# Patient Record
Sex: Male | Born: 1985 | Race: White | Hispanic: No | Marital: Single | Smoking: Current every day smoker
Health system: Southern US, Community
[De-identification: ages and names within clinical notes are randomized; demographics above are authoritative.]

## PROBLEM LIST (undated history)

## (undated) DIAGNOSIS — F419 Anxiety disorder, unspecified: Secondary | ICD-10-CM

---
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
## 2009-01-04 ENCOUNTER — Emergency Department (HOSPITAL_COMMUNITY): Admission: EM | Admit: 2009-01-04 | Discharge: 2009-01-04 | Payer: Self-pay | Admitting: Family Medicine

## 2009-05-13 ENCOUNTER — Emergency Department (HOSPITAL_COMMUNITY): Admission: EM | Admit: 2009-05-13 | Discharge: 2009-05-13 | Payer: Self-pay | Admitting: Family Medicine

## 2009-09-18 ENCOUNTER — Emergency Department (HOSPITAL_COMMUNITY): Admission: EM | Admit: 2009-09-18 | Discharge: 2009-09-18 | Payer: Self-pay | Admitting: Emergency Medicine

## 2009-09-23 ENCOUNTER — Emergency Department (HOSPITAL_COMMUNITY): Admission: EM | Admit: 2009-09-23 | Discharge: 2009-09-23 | Payer: Self-pay | Admitting: Family Medicine

## 2011-07-04 IMAGING — CR DG RIBS W/ CHEST 3+V*L*
3 series · 3 of 3 positions shown · non-contrast
Comparison: 09/18/2009

CLINICAL DATA: Chest pain

LEFT RIBS AND CHEST - 3+ VIEW

[view not recorded (1 of 3)]
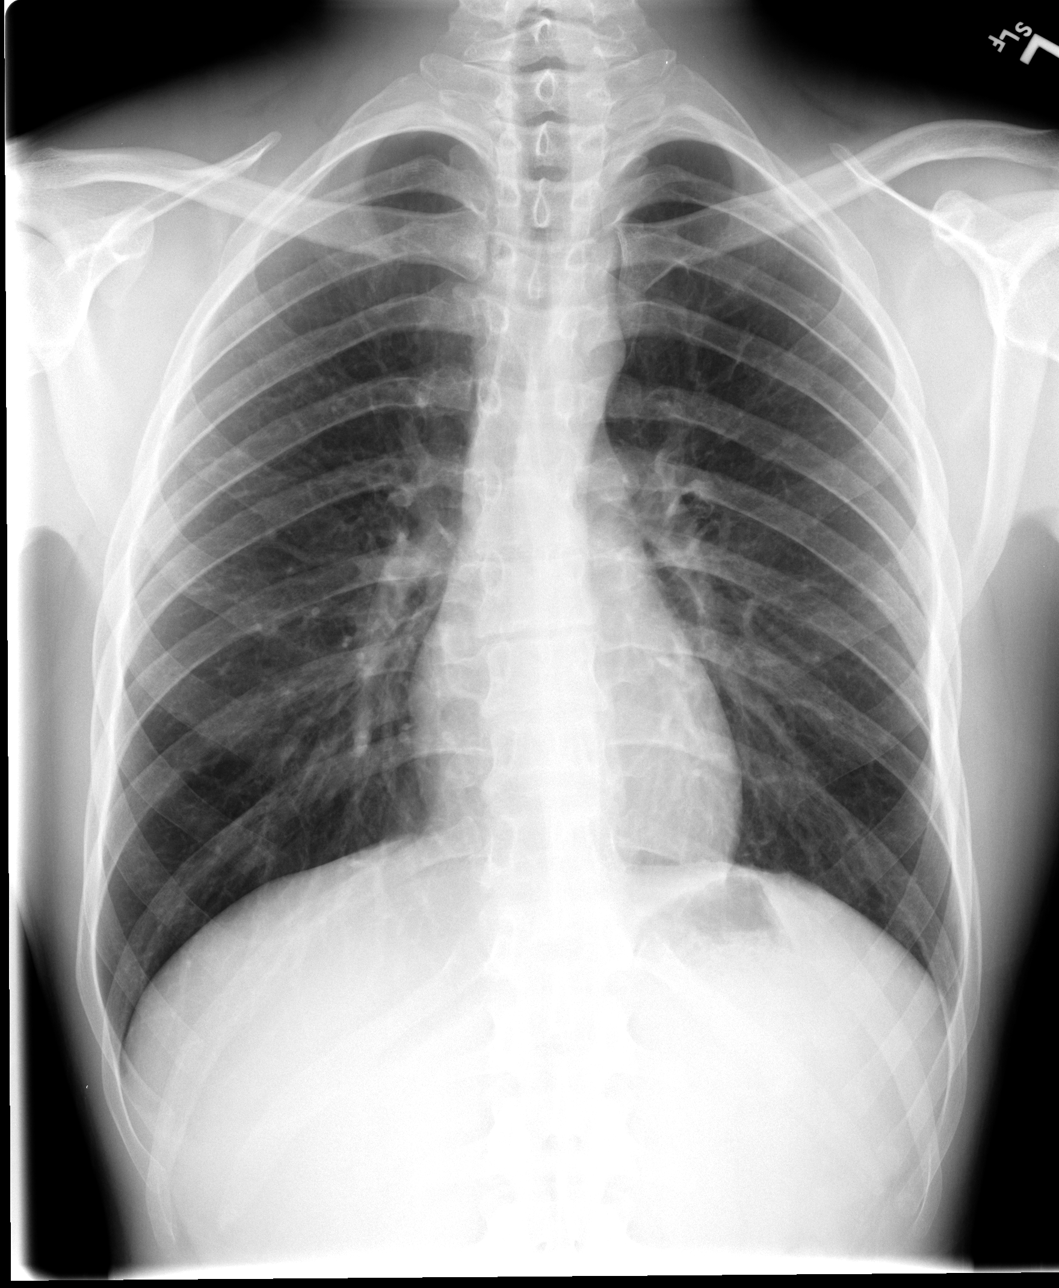

[view not recorded (2 of 3)]
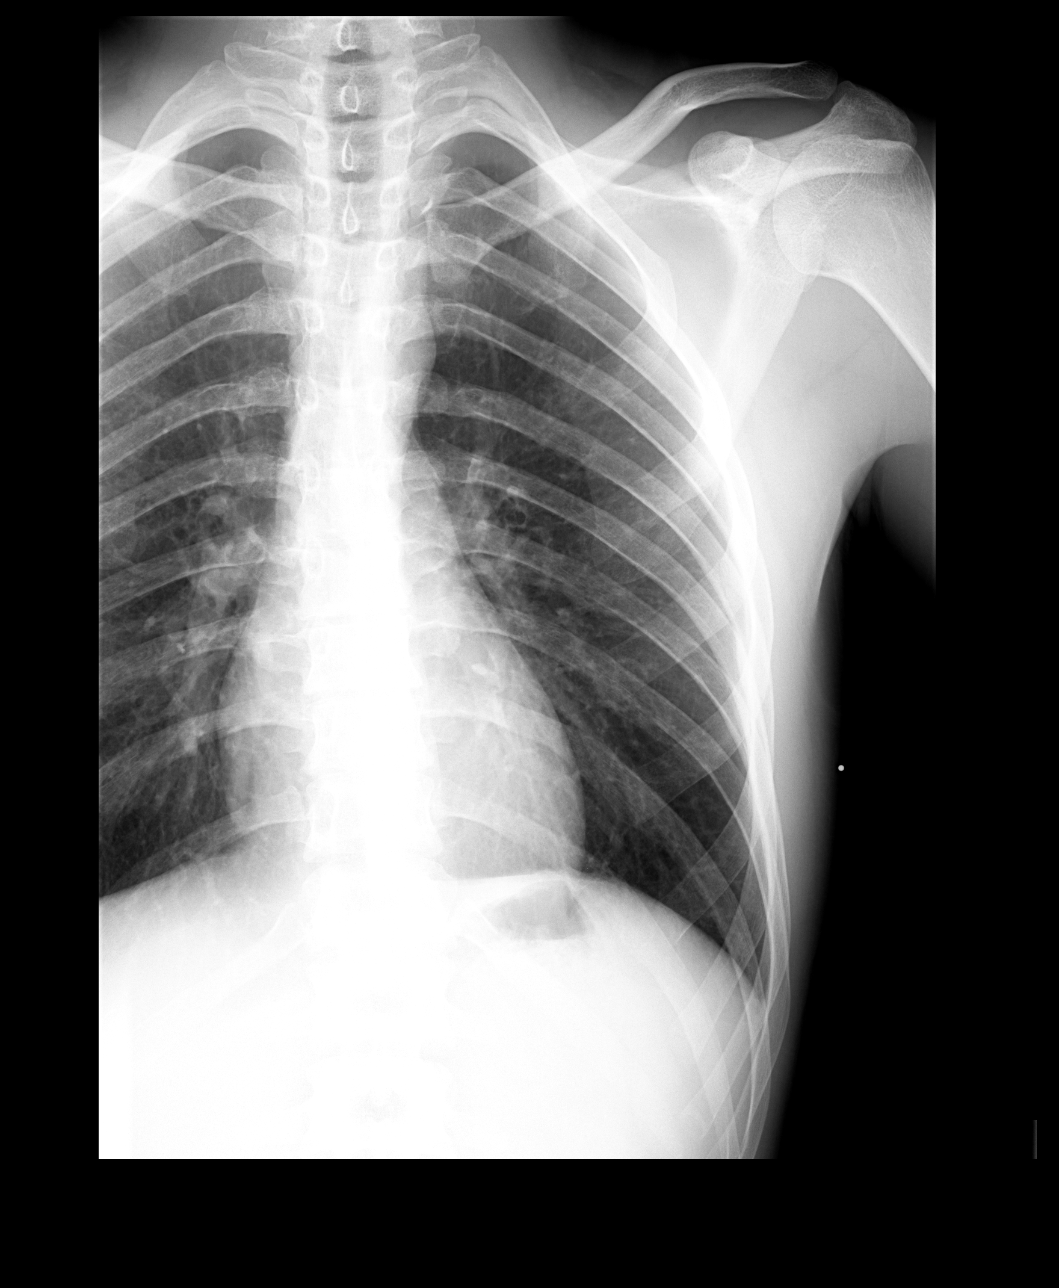

[view not recorded (3 of 3)]
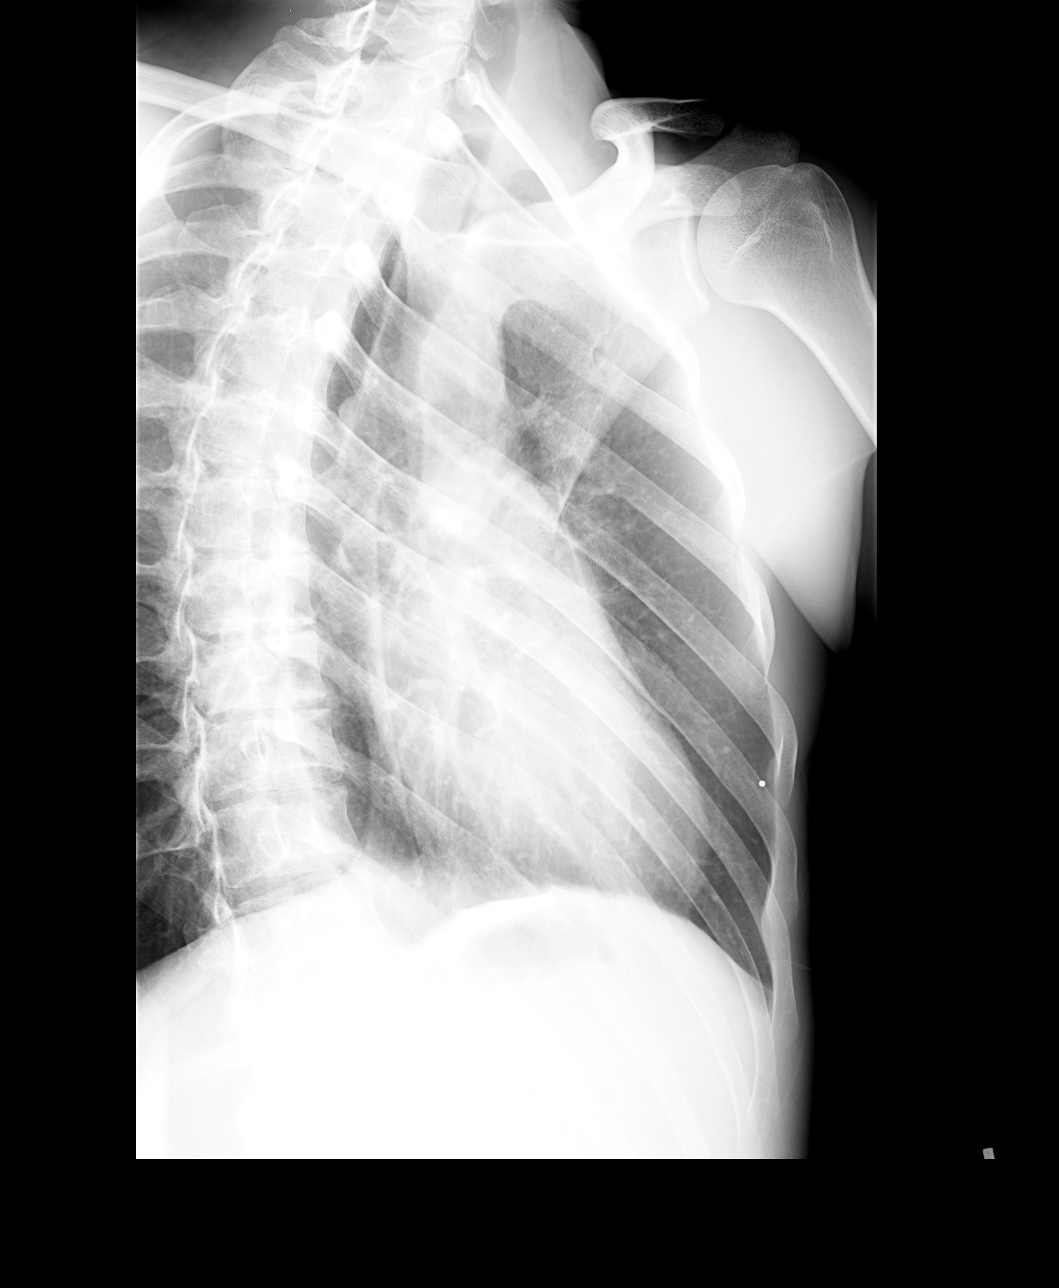

[3 of 3 positions shown; findings below may reference images not displayed]

FINDINGS: Heart size is normal.

No pleural effusion or pulmonary edema .

No airspace consolidation identified.

Linear lucency through the anterior aspect of the left sixth rib is
suspicious for nondisplaced fracture.

There is no additional fractures or dislocations identified.
IMPRESSION: 1.  No active cardiopulmonary abnormalities.
2.  Suspect nondisplaced fracture involving the anterior aspect of
the left sixth rib.

## 2011-07-07 ENCOUNTER — Encounter (HOSPITAL_COMMUNITY): Payer: Self-pay | Admitting: Emergency Medicine

## 2011-07-07 ENCOUNTER — Emergency Department (INDEPENDENT_AMBULATORY_CARE_PROVIDER_SITE_OTHER)
Admission: EM | Admit: 2011-07-07 | Discharge: 2011-07-07 | Disposition: A | Discharge status: Home / Self Care | Payer: Self-pay | Source: Home / Self Care | Attending: Emergency Medicine | Admitting: Emergency Medicine

## 2011-07-07 ENCOUNTER — Encounter (HOSPITAL_COMMUNITY): Payer: Self-pay

## 2011-07-07 ENCOUNTER — Emergency Department (HOSPITAL_COMMUNITY)
Admission: EM | Admit: 2011-07-07 | Discharge: 2011-07-07 | Discharge status: Against Medical Advice | Payer: Self-pay | Attending: Emergency Medicine | Admitting: Emergency Medicine

## 2011-07-07 DIAGNOSIS — S069X9A Unspecified intracranial injury with loss of consciousness of unspecified duration, initial encounter: Secondary | ICD-10-CM

## 2011-07-07 DIAGNOSIS — S0181XA Laceration without foreign body of other part of head, initial encounter: Secondary | ICD-10-CM

## 2011-07-07 DIAGNOSIS — S060X9A Concussion with loss of consciousness of unspecified duration, initial encounter: Secondary | ICD-10-CM

## 2011-07-07 DIAGNOSIS — S0180XA Unspecified open wound of other part of head, initial encounter: Secondary | ICD-10-CM

## 2011-07-07 HISTORY — DX: Anxiety disorder, unspecified: F41.9

## 2011-07-07 MED ORDER — IBUPROFEN 800 MG PO TABS: 800.0000 mg | ORAL_TABLET | Freq: Three times a day (TID) | ORAL | Status: AC

## 2011-07-07 NOTE — ED Notes (Signed)
dermabond at bedside for MD use
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
## 2011-07-07 NOTE — Discharge Instructions (Signed)
Head Injury, Adult You have had a head injury that does not appear serious at this time. A concussion is a state of changed mental ability, usually from a blow to the head. You should take clear liquids for the rest of the day and then resume your regular diet. You should not take sedatives or alcoholic beverages for as long as directed by your caregiver after discharge. After injuries such as yours, most problems occur within the first 24 hours. SYMPTOMS These minor symptoms may be experienced after discharge:  Memory difficulties.   Dizziness.   Headaches.   Double vision.   Hearing difficulties.   Depression.   Tiredness.   Weakness.   Difficulty with concentration.  If you experience any of these problems, you should not be alarmed. A concussion requires a few days for recovery. Many patients with head injuries frequently experience such symptoms. Usually, these problems disappear without medical care. If symptoms last for more than one day, notify your caregiver. See your caregiver sooner if symptoms are becoming worse rather than better. HOME CARE INSTRUCTIONS   During the next 24 hours you must stay with someone who can watch you for the warning signs listed below.  Although it is unlikely that serious side effects will occur, you should be aware of signs and symptoms which may necessitate your return to this location. Side effects may occur up to 7 - 10 days following the injury. It is important for you to carefully monitor your condition and contact your caregiver or seek immediate medical attention if there is a change in your condition. SEEK IMMEDIATE MEDICAL CARE IF:   There is confusion or drowsiness.   You can not awaken the injured person.   There is nausea (feeling sick to your stomach) or continued, forceful vomiting.   You notice dizziness or unsteadiness which is getting worse, or inability to walk.   You have convulsions or unconsciousness.   You experience  severe, persistent headaches not relieved by over-the-counter or prescription medicines for pain. (Do not take aspirin as this impairs clotting abilities). Take other pain medications only as directed.   You can not use arms or legs normally.   There is clear or bloody discharge from the nose or ears.  MAKE SURE YOU:   Understand these instructions.   Will watch your condition.   Will get help right away if you are not doing well or get worse.  Document Released: 03/01/2005 Document Revised: 02/18/2011 Document Reviewed: 01/17/2009 [facility] Patient Information 2012 [place], [place].Facial Laceration A facial laceration is a cut on the face. It can take 1 to 2 years for the scar to heal completely. HOME CARE  For stitches (sutures):  Keep the cut clean and dry.   If you have a bandage (dressing), change it at least once a day. Change the bandage if it gets wet or dirty, or as told by your doctor.   Wash the cut with soap and water 2 times a day. Rinse the cut with water. Pat it dry with a clean towel.   Put a thin layer of medicated cream on the cut as told by your doctor.   You may shower after the first 24 hours. Do not soak the cut in water until the stitches are removed.   Only take medicines as told by your doctor.   Have your stitches removed as told by your doctor.   Do not wear makeup until a few days after your stitches are removed.  For skin adhesive strips:  Keep the cut clean and dry.   Do not get the strips wet. You may take a bath, but be careful to keep the cut dry.   If the cut gets wet, pat it dry with a clean towel.   The strips will fall off on their own. Do not remove the strips that are still stuck to the cut.  For wound glue:  You may shower or take baths. Do not soak or scrub the cut. Do not swim. Avoid heavy sweating until the glue falls off on its own. After a shower or bath, pat the cut dry with a clean towel.   Do not put medicine or makeup on  your cut until the glue falls off.   If you have a bandage, do not put tape over the glue.   Avoid lots of sunlight or tanning lamps until the glue falls off. Put sunscreen on the cut for the first year to reduce your scar.   The glue will fall off on its own. Do not pick at the glue.  You may need a tetanus shot if:  You cannot remember when you had your last tetanus shot.   You have never had a tetanus shot.  If you need a tetanus shot and you choose not to have one, you may get tetanus. Sickness from tetanus can be serious. GET HELP RIGHT AWAY IF:   Your cut gets red, painful, or puffy (swollen).   There is yellowish-white fluid (pus) coming from the cut.   You have chills or a fever.  MAKE SURE YOU:   Understand these instructions.   Will watch your condition.   Will get help right away if you are not doing well or get worse.  Document Released: 08/18/2007 Document Revised: 02/18/2011 Document Reviewed: 08/25/2010 [facility] Patient Information 2012 [place], [place].Head Injury, Adult A head injury happens when the head is hit really hard. A head injury may cause sleepiness, headache, throwing up (vomiting), and problems seeing. If the head injury is really bad, you may need to stay in the hospital. HOME CARE  Have someone with you for the first 24 hours. This person should wake you up every 1 hour to check on your condition.   Only drink water or clear fluid for the rest of the day. Then, go back to your regular diet.   Only take medicines as told by your doctor. Do not take aspirin.   Do not drink alcohol for 2 days.   Do not take medicines that help your relax (sedatives) for 2 days.  Side effects may happen for up to 7 to 10 days. Watch for new problems. GET HELP RIGHT AWAY IF:   You are confused or sleepy.   You cannot be woken up.   You feel sick to your stomach (nauseous) or keep throwing up.   Your dizziness or unsteadiness is get worse, or your cannot walk.    You start to shake (convulse) or pass out (faint).   You have very bad, lasting headaches that are not helped by medicine.   You cannot use your arms or legs like normal.   You have clear or bloody fluid coming from your nose or ears.  MAKE SURE YOU:   Understand these instructions.   Will watch your condition.   Will get help right away if you are not doing well or get worse.  Document Released: 02/12/2008 Document Revised: 02/18/2011 Document Reviewed: 01/15/2009 [employer] Patient  Information 2012 [place], [place].
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
## 2011-11-29 ENCOUNTER — Emergency Department (HOSPITAL_COMMUNITY)
Admission: EM | Admit: 2011-11-29 | Discharge: 2011-11-30 | Disposition: A | Discharge status: Home / Self Care | Payer: Self-pay | Attending: Emergency Medicine | Admitting: Emergency Medicine

## 2011-11-29 ENCOUNTER — Encounter (HOSPITAL_COMMUNITY): Payer: Self-pay

## 2011-11-29 DIAGNOSIS — T401X1A Poisoning by heroin, accidental (unintentional), initial encounter: Secondary | ICD-10-CM | POA: Insufficient documentation

## 2011-11-29 DIAGNOSIS — F17200 Nicotine dependence, unspecified, uncomplicated: Secondary | ICD-10-CM | POA: Insufficient documentation

## 2011-11-29 DIAGNOSIS — F411 Generalized anxiety disorder: Secondary | ICD-10-CM | POA: Insufficient documentation

## 2011-11-29 DIAGNOSIS — T401X4A Poisoning by heroin, undetermined, initial encounter: Secondary | ICD-10-CM | POA: Insufficient documentation

## 2011-11-29 MED ORDER — NALOXONE HCL 1 MG/ML IJ SOLN
INTRAMUSCULAR | Status: AC
  Administered 2011-11-29: 2 mg
  Filled 2011-11-29: qty 4
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
## 2011-11-29 NOTE — ED Notes (Signed)
ONG:EXBMW<UX> Expected date:11/29/11<BR> Expected time: 8:59 PM<BR> Means of arrival:Ambulance<BR> Comments:<BR> Heroin overdose

## 2011-11-30 NOTE — ED Provider Notes (Signed)
History     CSN: [id number]  Arrival date & time 11/29/11  2118   First MD Initiated Contact with Patient 11/30/11 0014      Chief Complaint  Patient presents with  . Drug Overdose    (Consider location/radiation/quality/duration/timing/severity/associated sxs/prior treatment) HPI 26 yo male presents from home via EMS with reported unresponsive episode that responded to narcan.  Roommate found patient pale, diaphoretic, unresponsive.  Pt initially denied heroin use, but now reports use this evening.  Pt reports he used to use heroin regularly, but hasn't in several years.  He bought from [employer], quantity.  No other opiate use.  Pt without complaint at this time.   Past Medical History  Diagnosis Date  . Anxiety     History reviewed. No pertinent past surgical history.  Family History  Problem Relation Age of Onset  . Bipolar disorder Other     History  Substance Use Topics  . Smoking status: Current Some Day Smoker    Types: Cigarettes  . Smokeless tobacco: Not on file  . Alcohol Use: Yes      Review of Systems  All other systems reviewed and are negative.    Allergies  Review of patient's allergies indicates no known allergies.  Home Medications   Current Outpatient Rx  Name Route Sig Dispense Refill  . CLONAZEPAM 1 MG PO TABS Oral Take 0.5-1 mg by mouth daily.       BP 137/55  Pulse 82  Temp 97.4 F (36.3 C) (Oral)  Resp 18  SpO2 100%  Physical Exam  Nursing note and vitals reviewed. Constitutional: He is oriented to person, place, and time. He appears well-developed and well-nourished.  HENT:  Head: Normocephalic and atraumatic.  Nose: Nose normal.  Mouth/Throat: Oropharynx is clear and moist.  Eyes: Conjunctivae normal and EOM are normal. Pupils are equal, round, and reactive to light.  Neck: Normal range of motion. Neck supple. No JVD present. No tracheal deviation present. No thyromegaly present.  Cardiovascular: Normal rate, regular rhythm,  normal heart sounds and intact distal pulses.  Exam reveals no gallop and no friction rub.   No murmur heard. Pulmonary/Chest: Effort normal and breath sounds normal. No stridor. No respiratory distress. He has no wheezes. He has no rales. He exhibits no tenderness.  Abdominal: Soft. Bowel sounds are normal. He exhibits no distension and no mass. There is no tenderness. There is no rebound and no guarding.  Musculoskeletal: Normal range of motion. He exhibits no edema and no tenderness.  Lymphadenopathy:    He has no cervical adenopathy.  Neurological: He is alert and oriented to person, place, and time. A cranial nerve deficit is present. He exhibits normal muscle tone. Coordination normal.  Skin: Skin is dry. No rash noted. No erythema. No pallor.  Psychiatric: He has a normal mood and affect. His behavior is normal. Judgment and thought content normal.    ED Course  Procedures (including critical care time)  Labs Reviewed - No data to display No results found.   1. Accidental heroin overdose       MDM       Pt with accidental heroin overdose, has been stable here after narcan for over 4 hours.  Does not request detox or rehab help.    Olivia Mackie, MD 12/01/11 1118

## 2014-02-13 ENCOUNTER — Encounter: Payer: Self-pay | Admitting: Internal Medicine

## 2014-02-13 ENCOUNTER — Ambulatory Visit (HOSPITAL_BASED_OUTPATIENT_CLINIC_OR_DEPARTMENT_OTHER): Payer: Self-pay | Admitting: *Deleted

## 2014-02-13 ENCOUNTER — Ambulatory Visit: Payer: Self-pay | Attending: Internal Medicine | Admitting: Internal Medicine

## 2014-02-13 VITALS — BP 136/84 | HR 87 | Temp 98.0°F | Resp 16 | Ht 68.0 in | Wt 136.0 lb

## 2014-02-13 DIAGNOSIS — Z23 Encounter for immunization: Secondary | ICD-10-CM

## 2014-02-13 DIAGNOSIS — F1991 Other psychoactive substance use, unspecified, in remission: Secondary | ICD-10-CM

## 2014-02-13 DIAGNOSIS — F1921 Other psychoactive substance dependence, in remission: Secondary | ICD-10-CM

## 2014-02-13 DIAGNOSIS — Z0000 Encounter for general adult medical examination without abnormal findings: Secondary | ICD-10-CM

## 2014-02-13 DIAGNOSIS — F419 Anxiety disorder, unspecified: Secondary | ICD-10-CM | POA: Insufficient documentation

## 2014-02-13 DIAGNOSIS — Z720 Tobacco use: Secondary | ICD-10-CM

## 2014-02-13 DIAGNOSIS — F17200 Nicotine dependence, unspecified, uncomplicated: Secondary | ICD-10-CM | POA: Insufficient documentation

## 2014-02-13 DIAGNOSIS — F1990 Other psychoactive substance use, unspecified, uncomplicated: Secondary | ICD-10-CM | POA: Insufficient documentation

## 2014-02-13 DIAGNOSIS — Z0001 Encounter for general adult medical examination with abnormal findings: Secondary | ICD-10-CM | POA: Insufficient documentation

## 2014-02-13 DIAGNOSIS — F17210 Nicotine dependence, cigarettes, uncomplicated: Secondary | ICD-10-CM | POA: Insufficient documentation

## 2014-02-13 DIAGNOSIS — Z87898 Personal history of other specified conditions: Secondary | ICD-10-CM

## 2014-02-13 LAB — COMPLETE METABOLIC PANEL WITH GFR
ALK PHOS: 70 U/L (ref 39–117)
ALT: 83 U/L — AB (ref 0–53)
AST: 34 U/L (ref 0–37)
Albumin: 4.8 g/dL (ref 3.5–5.2)
BILIRUBIN TOTAL: 0.3 mg/dL (ref 0.2–1.2)
BUN: 11 mg/dL (ref 6–23)
CALCIUM: 10.2 mg/dL (ref 8.4–10.5)
CO2: 29 mEq/L (ref 19–32)
CREATININE: 0.85 mg/dL (ref 0.50–1.35)
Chloride: 105 mEq/L (ref 96–112)
GFR, Est African American: >89 mL/min
GFR, Est Non African American: >89 mL/min
Glucose, Bld: 96 mg/dL (ref 70–99)
Potassium: 4.8 mEq/L (ref 3.5–5.3)
Sodium: 143 mEq/L (ref 135–145)
Total Protein: 7.6 g/dL (ref 6.0–8.3)

## 2014-02-13 LAB — LIPID PANEL
CHOL/HDL RATIO: 3.1 ratio
Cholesterol: 136 mg/dL (ref 0–200)
HDL: 44 mg/dL (ref >39)
LDL CALC: 73 mg/dL (ref 0–99)
TRIGLYCERIDES: 97 mg/dL (ref <150)
VLDL: 19 mg/dL (ref 0–40)

## 2014-02-13 LAB — TSH: TSH: 1.063 u[IU]/mL (ref 0.350–4.500)

## 2014-02-13 NOTE — Progress Notes (Signed)
Patient ID: Vincent Cunningham, male   DOB: 04/20/1985, 28 y.o.   MRN: [id number]  [id number]  [id number]  DOB - 08/01/1985  CC:  Chief Complaint  Patient presents with  . Establish Care       HPI: Vincent Cunningham is a 28 y.o. male with a past medical history of anxiety and Hepatitis C, here today to establish medical care.  He has a long history of polysubstance abuse (heroin, crack, alcohol, and cocaine).  He states that he has been free of all drugs since November 9th of this year.  He reports that when he was in jail one year ago he had jaundice and two months ago he went to donate plasma and was told that he had traces of Hep C in his blood. He reports that he has a sponsor with AA who has been helping him to recognize his addiction.  He has no c/o today.    Patient has No headache, No chest pain, No abdominal pain - No Nausea, No new weakness tingling or numbness, No Cough - SOB.  No Known Allergies Past Medical History  Diagnosis Date  . Anxiety    Current Outpatient Prescriptions on File Prior to Visit  Medication Sig Dispense Refill  . clonazePAM (KLONOPIN) 1 MG tablet Take 0.5-1 mg by mouth daily.      No current facility-administered medications on file prior to visit.   Family History  Problem Relation Age of Onset  . Bipolar disorder Other    History   Social History  . Marital Status: Single    Spouse Name: N/A    Number of Children: N/A  . Years of Education: N/A   Occupational History  . Not on file.   Social History Main Topics  . Smoking status: Current Some Day Smoker    Types: Cigarettes  . Smokeless tobacco: Not on file  . Alcohol Use: Yes  . Drug Use: Yes    Special: Marijuana  . Sexual Activity: Not on file   Other Topics Concern  . Not on file   Social History Narrative    Review of Systems: Constitutional: Negative for fever, chills, diaphoresis, activity change, appetite change and fatigue. HENT: Negative for ear pain, nosebleeds,  congestion, facial swelling, rhinorrhea, neck pain, neck stiffness and ear discharge.  Eyes: Negative for pain, discharge, redness, itching and visual disturbance. Respiratory: Negative for cough, choking, chest tightness, shortness of breath, wheezing and stridor.  Cardiovascular: Negative for chest pain, palpitations and leg swelling. Gastrointestinal: Negative for abdominal distention. Genitourinary: Negative for dysuria, urgency, frequency, hematuria, flank pain, decreased urine volume, difficulty urinating and dyspareunia.  Musculoskeletal: Negative for back pain, joint swelling, arthralgia and gait problem. Neurological: Negative for dizziness, tremors, seizures, syncope, facial asymmetry, speech difficulty, weakness, light-headedness, numbness and headaches.  Hematological: Negative for adenopathy. Does not bruise/bleed easily. Psychiatric/Behavioral: Negative for hallucinations, behavioral problems, confusion, dysphoric mood, decreased concentration and agitation.    Objective:   Filed Vitals:   02/13/14 1017  BP: 136/84  Pulse: 87  Temp: 98 F (36.7 C)  Resp: 16    Physical Exam: Constitutional: Patient appears well-developed and well-nourished. No distress. HENT: Normocephalic, atraumatic, External right and left ear normal. Oropharynx is clear and moist.  Eyes: Conjunctivae and EOM are normal. PERRLA, no scleral icterus. Neck: Normal ROM. Neck supple. No JVD. No tracheal deviation. No thyromegaly. CVS: RRR, S1/S2 +, no murmurs, no gallops, no carotid bruit.  Pulmonary: Effort and breath sounds normal, no stridor, rhonchi, wheezes, rales.  Abdominal: Soft. BS +, no distension, tenderness, rebound or guarding. No hepatosplenomegaly.  Musculoskeletal: Normal range of motion. No edema and no tenderness.  Lymphadenopathy: No lymphadenopathy noted, cervical Neuro: Alert. Normal reflexes, muscle tone coordination. No cranial nerve deficit. Skin: Skin is warm and dry. No rash  noted. Not diaphoretic. No erythema. No pallor. Psychiatric: Normal mood and affect. Behavior, judgment, thought content normal.  No results found for: WBC, HGB, HCT, MCV, PLT No results found for: CREATININE, BUN, NA, K, CL, CO2  No results found for: HGBA1C Lipid Panel  No results found for: CHOL, TRIG, HDL, CHOLHDL, VLDL, LDLCALC     Assessment and plan:   Vincent Cunningham was seen today for establish care.  Diagnoses and associated orders for this visit:  Annual physical exam - Hepatitis panel, acute - COMPLETE METABOLIC PANEL WITH GFR - CBC - Lipid panel - TSH Will call patient will Hep results and refer to ID if needed.  Smoking Smoking cessation discussed for 3 minutes, patient is not willing to quit at this time. Will continue to assess on each visit. Discussed increased risk for diseases such as cancer, heart disease, and stroke. Patient states that he needs to focus on one addiction at a time and feels like smoking is helping to keep his mind off of drugs  Need for influenza vaccination Influenza injection received.  Explained side effects and contraindications to patient. Information sheet given to patient.  History of drug use Commended patient on his cessation of drugs.  Gave patient additional resources of where he can seek substance abuse counseling if needed    Return if symptoms worsen or fail to improve.  The patient was told to call to get lab results if they haven't heard anything in the next week.     Holland CommonsKECK, Casi Westerfeld, NP-C [facility] [phone] 02/13/2014, 10:31 AM

## 2014-02-13 NOTE — Progress Notes (Signed)
Pt is here to establish care. Pt is has Hep C.  Pt has questions and wants to seek treatment.

## 2014-02-13 NOTE — Patient Instructions (Signed)
Hepatitis C Hepatitis C is a viral infection of the liver. Infection may go undetected for months or years because symptoms may be absent or very mild. Chronic liver disease is the main danger of hepatitis C. This may lead to scarring of the liver (cirrhosis), liver failure, and liver cancer. CAUSES  Hepatitis C is caused by the hepatitis C virus (HCV). Formerly, hepatitis C infections were most commonly transmitted through blood transfusions. In the early 1990s, routine testing of donated blood for hepatitis C and exclusion of blood that tests positive for HCV began. Now, HCV is most commonly transmitted from person to person through injection drug use, sharing needles, or sex with an infected person. A caregiver may also get the infection from exposure to the blood of an infected patient by way of a cut or needle stick.  SYMPTOMS  Acute Phase Many cases of acute HCV infection are mild and cause few problems.Some people may not even realize they are sick.Symptoms in others may last a few weeks to several months and include:  Feeling very tired.  Loss of appetite.  Nausea.  Vomiting.  Abdominal pain.  Dark yellow urine.  Yellow skin and eyes (jaundice).  Itching of the skin. Chronic Phase  Between 50% to 85% of people who get HCV infection become "chronic carriers." They often have no symptoms, but the virus stays in their body.They may spread the virus to others and can get long-term liver disease.  Many people with chronic HCV infection remain healthy for many years. However, up to 1 in 5 chronically infected people may develop severe liver diseases including scarring of the liver (cirrhosis), liver failure, or liver cancer. DIAGNOSIS  Diagnosis of hepatitis C infection is made by testing blood for the presence of hepatitis C viral particles called RNA. Other tests may also be done to measure the status of current liver function, exclude other liver problems, or assess liver  damage. TREATMENT  Treatment with many antiviral drugs is available and recommended for some patients with chronic HCV infection. Drug treatment is generally considered appropriate for patients who:  Are 65 years of age or older.  Have a positive test for HCV particles in the blood.  Have a liver tissue sample (biopsy) that shows chronic hepatitis and significant scarring (fibrosis).  Do not have signs of liver failure.  Have acceptable blood test results that confirm the wellness of other body organs.  Are willing to be treated and conform to treatment requirements.  Have no other circumstances that would prevent treatment from being recommended (contraindications). All people who are offered and choose to receive drug treatment must understand that careful medical follow up for many months and even years is crucial in order to make successful care possible. The goal of drug treatment is to eliminate any evidence of HCV in the blood on a long-term basis. This is called a "sustained virologic response" or SVR. Achieving a SVR is associated with a decrease in the chance of life-threatening liver problems, need for a liver transplant, liver cancer rates, and liver-related complications. Successful treatment currently requires taking treatment drugs for at least 24 weeks and up to 72 weeks. An injected drug (interferon) given weekly and an oral antiviral medicine taken daily are usually prescribed. Side effects from these drugs are common and some may be very serious. Your response to treatment must be carefully monitored by both you and your caregiver throughout the entire treatment period. PREVENTION There is no vaccine for hepatitis C. The only  way to prevent the disease is to reduce the risk of exposure to the virus.   Avoid sharing drug needles or personal items like toothbrushes, razors, and nail clippers with an infected person.  Healthcare workers need to avoid injuries and wear  appropriate protective equipment such as gloves, gowns, and face masks when performing invasive medical or nursing procedures. HOME CARE INSTRUCTIONS  To avoid making your liver disease worse:  Strictly avoid drinking alcohol.  Carefully review all new prescriptions of medicines with your caregiver. Ask your caregiver which drugs you should avoid. The following drugs are toxic to the liver, and your caregiver may tell you to avoid them:  Isoniazid.  Methyldopa.  Acetaminophen.  Anabolic steroids (muscle-building drugs).  Erythromycin.  Oral contraceptives (birth control pills).  Check with your caregiver to make sure medicine you are currently taking will not be harmful.  Periodic blood tests may be required. Follow your caregiver's advice about when you should have blood tests.  Avoid a sexual relationship until advised otherwise by your caregiver.  Avoid activities that could expose other people to your blood. Examples include sharing a toothbrush, nail clippers, razors, and needles.  Bed rest is not necessary, but it may make you feel better. Recovery time is not related to the amount of rest you receive.  This infection is contagious. Follow your caregiver's instructions in order to avoid spread of the infection. SEEK IMMEDIATE MEDICAL CARE IF:  You have increasing fatigue or weakness.  You have an oral temperature above 102 F (38.9 C), not controlled by medicine.  You develop loss of appetite, nausea, or vomiting.  You develop jaundice.  You develop easy bruising or bleeding.  You develop any severe problems as a result of your treatment. MAKE SURE YOU:   Understand these instructions.  Will watch your condition.  Will get help right away if you are not doing well or get worse. Document Released: 02/27/2000 Document Revised: 05/24/2011 Document Reviewed: 06/13/2013 [facility] Patient Information 2015 [place], [place]. This information is not intended to replace  advice given to you by your health care provider. Make sure you discuss any questions you have with your health care provider.  Smoking Cessation Quitting smoking is important to your health and has many advantages. However, it is not always easy to quit since nicotine is a very addictive drug. Oftentimes, people try 3 times or more before being able to quit. This document explains the best ways for you to prepare to quit smoking. Quitting takes hard work and a lot of effort, but you can do it. ADVANTAGES OF QUITTING SMOKING  You will live longer, feel better, and live better.  Your body will feel the impact of quitting smoking almost immediately.  Within 20 minutes, blood pressure decreases. Your pulse returns to its normal level.  After 8 hours, carbon monoxide levels in the blood return to normal. Your oxygen level increases.  After 24 hours, the chance of having a heart attack starts to decrease. Your breath, hair, and body stop smelling like smoke.  After 48 hours, damaged nerve endings begin to recover. Your sense of taste and smell improve.  After 72 hours, the body is virtually free of nicotine. Your bronchial tubes relax and breathing becomes easier.  After 2 to 12 weeks, lungs can hold more air. Exercise becomes easier and circulation improves.  The risk of having a heart attack, stroke, cancer, or lung disease is greatly reduced.  After 1 year, the risk of coronary heart disease  is cut in half.  After 5 years, the risk of stroke falls to the same as a nonsmoker.  After 10 years, the risk of lung cancer is cut in half and the risk of other cancers decreases significantly.  After 15 years, the risk of coronary heart disease drops, usually to the level of a nonsmoker.  If you are pregnant, quitting smoking will improve your chances of having a healthy baby.  The people you live with, especially any children, will be healthier.  You will have extra money to spend on things  other than cigarettes. QUESTIONS TO THINK ABOUT BEFORE ATTEMPTING TO QUIT You may want to talk about your answers with your health care provider.  Why do you want to quit?  If you tried to quit in the past, what helped and what did not?  What will be the most difficult situations for you after you quit? How will you plan to handle them?  Who can help you through the tough times? Your family? Friends? A health care provider?  What pleasures do you get from smoking? What ways can you still get pleasure if you quit? Here are some questions to ask your health care provider:  How can you help me to be successful at quitting?  What medicine do you think would be best for me and how should I take it?  What should I do if I need more help?  What is smoking withdrawal like? How can I get information on withdrawal? GET READY  Set a quit date.  Change your environment by getting rid of all cigarettes, ashtrays, matches, and lighters in your home, car, or work. Do not let people smoke in your home.  Review your past attempts to quit. Think about what worked and what did not. GET SUPPORT AND ENCOURAGEMENT You have a better chance of being successful if you have help. You can get support in many ways.  Tell your family, friends, and coworkers that you are going to quit and need their support. Ask them not to smoke around you.  Get individual, group, or telephone counseling and support. Programs are available at [employer] and health centers. Call your local health department for information about programs in your area.  Spiritual beliefs and practices may help some smokers quit.  Download a "quit meter" on your computer to keep track of quit statistics, such as how long you have gone without smoking, cigarettes not smoked, and money saved.  Get a self-help book about quitting smoking and staying off tobacco. LEARN NEW SKILLS AND BEHAVIORS  Distract yourself from urges to smoke. Talk  to someone, go for a walk, or occupy your time with a task.  Change your normal routine. Take a different route to work. Drink tea instead of coffee. Eat breakfast in a different place.  Reduce your stress. Take a hot bath, exercise, or read a book.  Plan something enjoyable to do every day. Reward yourself for not smoking.  Explore interactive web-based programs that specialize in helping you quit. GET MEDICINE AND USE IT CORRECTLY Medicines can help you stop smoking and decrease the urge to smoke. Combining medicine with the above behavioral methods and support can greatly increase your chances of successfully quitting smoking.  Nicotine replacement therapy helps deliver nicotine to your body without the negative effects and risks of smoking. Nicotine replacement therapy includes nicotine gum, lozenges, inhalers, nasal sprays, and skin patches. Some may be available over-the-counter and others require a prescription.  Antidepressant medicine helps people abstain from smoking, but how this works is unknown. This medicine is available by prescription.  Nicotinic receptor partial agonist medicine simulates the effect of nicotine in your brain. This medicine is available by prescription. Ask your health care provider for advice about which medicines to use and how to use them based on your health history. Your health care provider will tell you what side effects to look out for if you choose to be on a medicine or therapy. Carefully read the information on the package. Do not use any other product containing nicotine while using a nicotine replacement product.  RELAPSE OR DIFFICULT SITUATIONS Most relapses occur within the first 3 months after quitting. Do not be discouraged if you start smoking again. Remember, most people try several times before finally quitting. You may have symptoms of withdrawal because your body is used to nicotine. You may crave cigarettes, be irritable, feel very hungry,  cough often, get headaches, or have difficulty concentrating. The withdrawal symptoms are only temporary. They are strongest when you first quit, but they will go away within 10-14 days. To reduce the chances of relapse, try to:  Avoid drinking alcohol. Drinking lowers your chances of successfully quitting.  Reduce the amount of caffeine you consume. Once you quit smoking, the amount of caffeine in your body increases and can give you symptoms, such as a rapid heartbeat, sweating, and anxiety.  Avoid smokers because they can make you want to smoke.  Do not let weight gain distract you. Many smokers will gain weight when they quit, usually less than 10 pounds. Eat a healthy diet and stay active. You can always lose the weight gained after you quit.  Find ways to improve your mood other than smoking. FOR MORE INFORMATION  www.smokefree.gov  Document Released: 02/23/2001 Document Revised: 07/16/2013 Document Reviewed: 06/10/2011 [facility]Exit[facility] Patient Information 2015 [place]ExitCare, [place]LL[place]. This information is not intended to replace advice given to you by your health care provider. Make sure you discuss any questions you have with your health care provider.

## 2014-02-14 LAB — HEPATITIS PANEL, ACUTE
HCV AB: REACTIVE — AB
HEP A IGM: NONREACTIVE
HEP B C IGM: NONREACTIVE
Hepatitis B Surface Ag: NEGATIVE

## 2014-02-14 LAB — CBC
HEMATOCRIT: 41.6 % (ref 39.0–52.0)
HEMOGLOBIN: 14.6 g/dL (ref 13.0–17.0)
MCH: 31.7 pg (ref 26.0–34.0)
MCHC: 35.1 g/dL (ref 30.0–36.0)
MCV: 90.2 fL (ref 78.0–100.0)
MPV: 9.9 fL (ref 9.4–12.4)
PLATELETS: 204 10*3/uL (ref 150–400)
RBC: 4.61 MIL/uL (ref 4.22–5.81)
RDW: 14 % (ref 11.5–15.5)
WBC: 4.7 10*3/uL (ref 4.0–10.5)

## 2014-02-15 ENCOUNTER — Encounter: Payer: Self-pay | Admitting: *Deleted

## 2014-02-15 LAB — HEPATITIS C RNA QUANTITATIVE

## 2014-02-15 NOTE — Progress Notes (Signed)
The lab called needed another lab test and asked me to have the pt return for another lab draw. I spoke to the pt and asked him to come in for another lab. I sent him to the schedulers to get on the schedule for next week.

## 2014-02-18 ENCOUNTER — Ambulatory Visit: Payer: Self-pay | Attending: Internal Medicine

## 2014-02-18 DIAGNOSIS — B1920 Unspecified viral hepatitis C without hepatic coma: Secondary | ICD-10-CM

## 2014-02-19 LAB — HEPATITIS C RNA QUANTITATIVE
HCV Quantitative Log: 4.54 {Log} — ABNORMAL HIGH (ref <1.18)
HCV Quantitative: 34420 IU/mL — ABNORMAL HIGH (ref <15)

## 2014-02-19 LAB — HEPATITIS C ANTIBODY: HCV Ab: REACTIVE — AB

## 2014-03-04 ENCOUNTER — Telehealth: Payer: Self-pay | Admitting: Internal Medicine

## 2014-03-04 DIAGNOSIS — B182 Chronic viral hepatitis C: Secondary | ICD-10-CM

## 2014-03-04 NOTE — Telephone Encounter (Signed)
Patient calling to follow up on a referral request, states that him and his doctor spoke about having him setup up to see a Hepatitis specialist (?)  Patient was just in for OV on 02/13/14. Please follow up with patient.

## 2014-03-06 NOTE — Telephone Encounter (Signed)
This pt has never been seen here.

## 2014-04-02 NOTE — Telephone Encounter (Signed)
-----   Message from Ambrose FinlandValerie A Keck, NP sent at 02/19/2014 11:17 PM EST ----- Please send referral to [facility] for Hep C

## 2014-04-02 NOTE — Telephone Encounter (Signed)
Referral placed.

## 2014-04-09 ENCOUNTER — Other Ambulatory Visit: Payer: No Typology Code available for payment source

## 2014-04-09 DIAGNOSIS — B182 Chronic viral hepatitis C: Secondary | ICD-10-CM

## 2014-04-09 LAB — PROTIME-INR
INR: 0.99 (ref <1.50)
Prothrombin Time: 13.1 seconds (ref 11.6–15.2)

## 2014-04-09 LAB — HIV ANTIBODY (ROUTINE TESTING W REFLEX): HIV 1&2 Ab, 4th Generation: NONREACTIVE

## 2014-04-09 LAB — IRON: IRON: 238 ug/dL — AB (ref 42–165)

## 2014-04-09 LAB — HEPATITIS B SURFACE ANTIBODY,QUALITATIVE

## 2014-04-10 LAB — ANTI-NUCLEAR AB-TITER (ANA TITER): ANA Titer 1: 1:40 {titer} — ABNORMAL HIGH

## 2014-04-10 LAB — ANA: Anti Nuclear Antibody(ANA): POSITIVE — AB

## 2014-04-10 LAB — HEPATITIS C RNA QUANTITATIVE
HCV QUANT LOG: 1.89 {Log} — AB (ref <1.18)
HCV Quantitative: 77 IU/mL — ABNORMAL HIGH (ref <15)

## 2014-04-11 LAB — HEPATITIS C GENOTYPE

## 2014-04-12 ENCOUNTER — Telehealth: Payer: Self-pay | Admitting: *Deleted

## 2014-04-12 NOTE — Telephone Encounter (Signed)
Per Dr. Luciana Axeomer; patient does not need to be seen by him, because his HCV virus appears to be clearing on it's own. He forwarded this message to Holland CommonsValerie Keck, NP, referring provider. Wendall MolaJacqueline Cockerham CMA

## 2014-05-15 ENCOUNTER — Ambulatory Visit: Payer: No Typology Code available for payment source | Admitting: Internal Medicine

## 2014-06-05 ENCOUNTER — Ambulatory Visit (INDEPENDENT_AMBULATORY_CARE_PROVIDER_SITE_OTHER): Payer: No Typology Code available for payment source | Admitting: Internal Medicine

## 2014-06-05 ENCOUNTER — Encounter: Payer: Self-pay | Admitting: Internal Medicine

## 2014-06-05 VITALS — BP 138/78 | HR 102 | Temp 97.6°F | Ht 68.0 in | Wt 134.0 lb

## 2014-06-05 DIAGNOSIS — B182 Chronic viral hepatitis C: Secondary | ICD-10-CM

## 2014-06-05 NOTE — Progress Notes (Signed)
   Subjective:    Patient ID: Vincent Cunningham, male    DOB: 09/17/1985, 29 y.o.   MRN: [id number]  HPI He comes in for new patient evaluation of hepatitis C. He has a history of drug abuse including IV drugs though has been clear of drug since November 2015. Weights a history of jaundice several months ago and when he was donating plasma was told he had hepatitis C.  He was evaluated by his primary physician in December 2015 and had a viral load of 34,000. He then came for evaluation for hepatitis C and had his viral load and genotype ordered however his viral load was only 77 copies and a genotype was unable to be performed due to low viral load. He otherwise has had no new problems. HIV is negative.   Review of Systems  Constitutional: Negative for fatigue.  HENT: Negative for trouble swallowing.   Respiratory: Negative for shortness of breath.   Gastrointestinal: Negative for nausea, abdominal pain and diarrhea.  Skin: Negative for rash.  Neurological: Negative for dizziness, light-headedness and headaches.  Psychiatric/Behavioral: The patient is not nervous/anxious.        Objective:   Physical Exam  Constitutional: He appears well-developed and well-nourished. No distress.  HENT:  Mouth/Throat: No oropharyngeal exudate.  Eyes: No scleral icterus.  Cardiovascular: Normal rate, regular rhythm and normal heart sounds.   No murmur heard. Pulmonary/Chest: Effort normal and breath sounds normal. No respiratory distress. He has no wheezes.  Lymphadenopathy:    He has no cervical adenopathy.  Skin: No rash noted.          Assessment & Plan:

## 2014-06-05 NOTE — Assessment & Plan Note (Signed)
Seems to be converting to negative of hepatitis C. I will recheck again in 6 months and confirm this. This was discussed with the patient and he was pleased with results. He seems to be one of the 20% who cleared on their own in the first year after infection.

## 2014-11-28 ENCOUNTER — Ambulatory Visit: Payer: No Typology Code available for payment source | Admitting: Internal Medicine

## 2014-12-19 ENCOUNTER — Ambulatory Visit: Payer: No Typology Code available for payment source | Admitting: Internal Medicine

## 2019-02-09 ENCOUNTER — Emergency Department (HOSPITAL_COMMUNITY): Payer: No Typology Code available for payment source

## 2019-02-09 ENCOUNTER — Emergency Department (HOSPITAL_COMMUNITY)
Admission: EM | Admit: 2019-02-09 | Discharge: 2019-02-09 | Disposition: A | Discharge status: Home / Self Care | Payer: No Typology Code available for payment source | Attending: Emergency Medicine | Admitting: Emergency Medicine

## 2019-02-09 ENCOUNTER — Other Ambulatory Visit: Payer: Self-pay

## 2019-02-09 DIAGNOSIS — F17210 Nicotine dependence, cigarettes, uncomplicated: Secondary | ICD-10-CM | POA: Insufficient documentation

## 2019-02-09 DIAGNOSIS — Y9389 Activity, other specified: Secondary | ICD-10-CM | POA: Insufficient documentation

## 2019-02-09 DIAGNOSIS — X58XXXA Exposure to other specified factors, initial encounter: Secondary | ICD-10-CM | POA: Insufficient documentation

## 2019-02-09 DIAGNOSIS — T50901A Poisoning by unspecified drugs, medicaments and biological substances, accidental (unintentional), initial encounter: Secondary | ICD-10-CM | POA: Insufficient documentation

## 2019-02-09 DIAGNOSIS — S0081XA Abrasion of other part of head, initial encounter: Secondary | ICD-10-CM | POA: Insufficient documentation

## 2019-02-09 DIAGNOSIS — Y906 Blood alcohol level of 120-199 mg/100 ml: Secondary | ICD-10-CM | POA: Insufficient documentation

## 2019-02-09 DIAGNOSIS — Y92019 Unspecified place in single-family (private) house as the place of occurrence of the external cause: Secondary | ICD-10-CM | POA: Insufficient documentation

## 2019-02-09 DIAGNOSIS — Y998 Other external cause status: Secondary | ICD-10-CM | POA: Insufficient documentation

## 2019-02-09 DIAGNOSIS — F1010 Alcohol abuse, uncomplicated: Secondary | ICD-10-CM | POA: Insufficient documentation

## 2019-02-09 LAB — CBC WITH DIFFERENTIAL/PLATELET
Abs Immature Granulocytes: 0 10*3/uL (ref 0.00–0.07)
Basophils Absolute: 0.1 10*3/uL (ref 0.0–0.1)
Basophils Relative: 1 %
Eosinophils Absolute: 0.1 10*3/uL (ref 0.0–0.5)
Eosinophils Relative: 2 %
HCT: 46.6 % (ref 39.0–52.0)
Hemoglobin: 16.2 g/dL (ref 13.0–17.0)
Immature Granulocytes: 0 %
Lymphocytes Relative: 41 %
Lymphs Abs: 3.1 10*3/uL (ref 0.7–4.0)
MCH: 33.5 pg (ref 26.0–34.0)
MCHC: 34.8 g/dL (ref 30.0–36.0)
MCV: 96.5 fL (ref 80.0–100.0)
Monocytes Absolute: 0.6 10*3/uL (ref 0.1–1.0)
Monocytes Relative: 8 %
Neutro Abs: 3.7 10*3/uL (ref 1.7–7.7)
Neutrophils Relative %: 48 %
Platelets: 180 10*3/uL (ref 150–400)
RBC: 4.83 MIL/uL (ref 4.22–5.81)
RDW: 11.8 % (ref 11.5–15.5)
WBC: 7.5 10*3/uL (ref 4.0–10.5)
nRBC: 0 % (ref 0.0–0.2)

## 2019-02-09 LAB — COMPREHENSIVE METABOLIC PANEL
ALT: 109 U/L — ABNORMAL HIGH (ref 0–44)
AST: 78 U/L — ABNORMAL HIGH (ref 15–41)
Albumin: 4 g/dL (ref 3.5–5.0)
Alkaline Phosphatase: 53 U/L (ref 38–126)
Anion gap: 13 (ref 5–15)
BUN: 7 mg/dL (ref 6–20)
CO2: 21 mmol/L — ABNORMAL LOW (ref 22–32)
Calcium: 8.1 mg/dL — ABNORMAL LOW (ref 8.9–10.3)
Chloride: 103 mmol/L (ref 98–111)
Creatinine, Ser: 0.93 mg/dL (ref 0.61–1.24)
GFR calc Af Amer: >60 mL/min (ref >60)
GFR calc non Af Amer: >60 mL/min (ref >60)
Glucose, Bld: 95 mg/dL (ref 70–99)
Potassium: 4.5 mmol/L (ref 3.5–5.1)
Sodium: 137 mmol/L (ref 135–145)
Total Bilirubin: 0.9 mg/dL (ref 0.3–1.2)
Total Protein: 6.7 g/dL (ref 6.5–8.1)

## 2019-02-09 LAB — ETHANOL: Alcohol, Ethyl (B): 184 mg/dL — ABNORMAL HIGH (ref <10)

## 2019-02-09 LAB — SALICYLATE LEVEL: Salicylate Lvl: <7 mg/dL (ref 2.8–30.0)

## 2019-02-09 NOTE — ED Notes (Signed)
Pt tolerated PO intake without difficulty

## 2019-02-09 NOTE — ED Triage Notes (Signed)
Pt here via EMS after overdosing on heroin. Pt's friend said he called 911 immediately after pt went unconscious. Lac to forehead. Pt received 2 narcan with FD and 2 with EMS with improvement. Pt alert on arrival, A/O x 2, disoriented to time.

## 2019-02-09 NOTE — Discharge Instructions (Addendum)
Recommend refraining from any further heroin or opiate use.  Recommend limiting alcohol use.  Please follow-up with your primary doctor next week for recheck and to discuss assistance with substance abuse.  Return to ER if you develop any difficulty breathing, passing out, chest pain or other new concerning symptom.

## 2019-02-09 NOTE — ED Notes (Signed)
Patient transported to CT 

## 2019-02-09 NOTE — ED Notes (Signed)
Vincent Cunningham (Fiancee#[phone]).

## 2019-02-09 NOTE — ED Provider Notes (Signed)
[facility] EMERGENCY DEPARTMENT Provider Note   CSN: [id number] Arrival date & time: 02/09/19  1448     History   Chief Complaint Chief Complaint  Patient presents with  . Drug Overdose    HPI Vincent Cunningham is a 33 y.o. male.  Presents emerge department with chief complaint of accidental drug overdose.  Patient states he remembers waking up this morning, being at his house, but does not recall any further details.  States he has past history of heroin abuse, does not remove doing any heroin today though.  Denies any thoughts of wanting to hurt himself, hurt others.  Has no complaints at this time.  Physically denies any head pain, neck pain, back pain, extremity pain, chest pain, abdominal pain.     HPI  Past Medical History:  Diagnosis Date  . Anxiety     Patient Active Problem List   Diagnosis Date Noted  . Chronic hepatitis C without hepatic coma ([place]) 06/05/2014  . Smoking 02/13/2014  . History of drug use 02/13/2014    No past surgical history on file.      Home Medications    Prior to Admission medications   Not on File    Family History Family History  Problem Relation Age of Onset  . Bipolar disorder Other     Social History Social History   Tobacco Use  . Smoking status: Current Every Day Smoker    Packs/day: 0.50    Types: Cigarettes    Start date: 03/15/1998  . Smokeless tobacco: Never Used  Substance Use Topics  . Alcohol use: No    Alcohol/week: 0.0 standard drinks    Comment: pt states no alchohol since 01/13/14  . Drug use: No    Types: Marijuana    Comment: pt states no drugs since 01/13/14     Allergies   Patient has no known allergies.   Review of Systems Review of Systems  Constitutional: Negative for chills and fever.  HENT: Negative for ear pain and sore throat.   Eyes: Negative for pain and visual disturbance.  Respiratory: Negative for cough and shortness of breath.   Cardiovascular: Negative for  chest pain and palpitations.  Gastrointestinal: Negative for abdominal pain and vomiting.  Genitourinary: Negative for dysuria and hematuria.  Musculoskeletal: Negative for arthralgias and back pain.  Skin: Negative for color change and rash.  Neurological: Negative for seizures and syncope.  All other systems reviewed and are negative.    Physical Exam Updated Vital Signs BP 129/75   Pulse (!) 111   Temp (!) 96.8 F (36 C) (Tympanic)   Resp 13   SpO2 97%   Physical Exam Vitals signs and nursing note reviewed.  Constitutional:      Appearance: He is well-developed.  HENT:     Head: Normocephalic.     Comments: Superficial abrasion noted over right forehead, no active bleeding Eyes:     Conjunctiva/sclera: Conjunctivae normal.  Neck:     Musculoskeletal: Normal range of motion and neck supple.     Comments: No midline C-spine tenderness Cardiovascular:     Rate and Rhythm: Normal rate and regular rhythm.     Heart sounds: No murmur.  Pulmonary:     Effort: Pulmonary effort is normal. No respiratory distress.     Breath sounds: Normal breath sounds.  Abdominal:     Palpations: Abdomen is soft.     Tenderness: There is no abdominal tenderness.  Musculoskeletal:  Comments: No midline T or L-spine tenderness, no tenderness palpation throughout all 4 extremities, no deformity noted  Skin:    General: Skin is warm and dry.  Neurological:     Mental Status: He is alert.     Comments: Alert, oriented to person, place, time      ED Treatments / Results  Labs (all labs ordered are listed, but only abnormal results are displayed) Labs Reviewed - No data to display  EKG None  Radiology No results found.  Procedures Procedures (including critical care time)  Medications Ordered in ED Medications - No data to display   Initial Impression / Assessment and Plan / ED Course  I have reviewed the triage vital signs and the nursing notes.  Pertinent labs &  imaging results that were available during my care of the patient were reviewed by me and considered in my medical decision making (see chart for details).  Clinical Course as of Feb 08 1825  Fri Feb 09, 2019  1533 Complete initial assessment   [RD]  1818 Rechecked, remains well appearing, normal vitals, no symptoms, will dc   [RD]    Clinical Course User Index [RD] Milagros Loll, MD       33 year old male with past medical history of polysubstance abuse presented to ER after suspected accidental overdose.  Patient responded to Narcan given prior to arrival.  Here patient was AAO x3.  Had no ongoing symptoms.  Noted superficial abrasion to his forehead but no other trauma noted.  CT head negative.  Patient observed in ER for around 4 hours.  No further issues with changes in mental status or breathing.  No SI, HI, patient consistently reported this was accidental.  Reviewed return precautions, will discharge home.    After the discussed management above, the patient was determined to be safe for discharge.  The patient was in agreement with this plan and all questions regarding their care were answered.  ED return precautions were discussed and the patient will return to the ED with any significant worsening of condition.    Final Clinical Impressions(s) / ED Diagnoses   Final diagnoses:  Accidental overdose, initial encounter  Alcohol abuse    ED Discharge Orders    None       Milagros Loll, MD 02/09/19 [phone]

## 2019-03-21 ENCOUNTER — Encounter (HOSPITAL_COMMUNITY): Payer: Self-pay

## 2019-03-21 ENCOUNTER — Other Ambulatory Visit: Payer: Self-pay

## 2019-03-21 ENCOUNTER — Ambulatory Visit (HOSPITAL_COMMUNITY)
Admission: EM | Admit: 2019-03-21 | Discharge: 2019-03-21 | Disposition: A | Discharge status: Home / Self Care | Payer: No Typology Code available for payment source | Attending: Family Medicine | Admitting: Family Medicine

## 2019-03-21 DIAGNOSIS — S6992XA Unspecified injury of left wrist, hand and finger(s), initial encounter: Secondary | ICD-10-CM

## 2019-03-21 DIAGNOSIS — T148XXA Other injury of unspecified body region, initial encounter: Secondary | ICD-10-CM

## 2019-03-21 DIAGNOSIS — L089 Local infection of the skin and subcutaneous tissue, unspecified: Secondary | ICD-10-CM

## 2019-03-21 MED ORDER — AMOXICILLIN-POT CLAVULANATE 875-125 MG PO TABS: 1.0000 | ORAL_TABLET | Freq: Two times a day (BID) | ORAL | 0 refills | Status: DC

## 2019-03-21 NOTE — ED Triage Notes (Signed)
Pt states he was bitten by a dog on Saturday.  ( left hand ).

## 2019-03-21 NOTE — ED Notes (Signed)
Bed: UC04 Expected date:  Expected time:  Means of arrival:  Comments: Aline August

## 2019-03-22 NOTE — ED Provider Notes (Signed)
[facility] CARE CENTER   [id number] 03/21/19 Arrival Time: 1638  ASSESSMENT & PLAN:  1. Hand injury, left, initial encounter   2. Post-traumatic wound infection     Though recommended, he declines imaging of his hand today secondary to cost. Wound is beginning to become infected. No s/s of sepsis. No abscess noted.  Begin: Meds ordered this encounter  Medications  . amoxicillin-clavulanate (AUGMENTIN) 875-125 MG tablet    Sig: Take 1 tablet by mouth every 12 (twelve) hours.    Dispense:  20 tablet    Refill:  0   Close observation.  Recommend: Follow-up Information    Bradly Bienenstock, MD.   Specialty: Orthopedic Surgery Contact information: [address] [place] [address] [phone]          Given report of dog bite, no indication for rabies vaccination at this time given time period from reported bite.  Rest the injured area as much as practical. OTC analgesics if needed.  Reviewed expectations re: course of current medical issues. Questions answered. Outlined signs and symptoms indicating need for more acute intervention. Patient verbalized understanding. After Visit Summary given.  SUBJECTIVE: History from: patient. Vincent Cunningham is a 34 y.o. male who reports a "dog bit my hand"; left; approx 4 days ago. Washed well. Reports no finger/hand ROM loss. No extremity sensation changes or weakness. Has noticed mild swelling over the past couple of days. No drainage or bleeding. No specific aggravating or alleviating factors reported.  Associated symptoms: none reported. History of similar: no.  History reviewed. No pertinent surgical history.   ROS: As per HPI. All other systems negative.    OBJECTIVE:  Vitals:   03/21/19 1705 03/21/19 1707  BP:  129/75  Pulse:  (!) 110  Resp:  18  Temp:  98.7 F (37.1 C)  SpO2:  100%  Weight: 63.5 kg     General appearance: alert; no distress HEENT: [place]; AT Neck: supple with FROM Resp: unlabored  respirations Extremities: . LUE: warm with well perfused appearance; small puncture over distal 5th metacarpal; S-curve shaped wound on side of 5th metacarpal; mild surrounding erythema; minimal TTP; no drainage or bleeding; all fingers with normal strength and ROM{ CV: brisk extremity capillary refill of LUE; 2+ radial pulse of LUE. Skin: warm and dry; no visible rashes Neurologic: normal sensation of LUE Psychological: alert and cooperative; normal mood and affect    No Known Allergies  Past Medical History:  Diagnosis Date  . Anxiety    Social History   Socioeconomic History  . Marital status: Single    Spouse name: Not on file  . Number of children: Not on file  . Years of education: Not on file  . Highest education level: Not on file  Occupational History  . Not on file  Tobacco Use  . Smoking status: Current Every Day Smoker    Packs/day: 0.50    Types: Cigarettes    Start date: 03/15/1998  . Smokeless tobacco: Never Used  Substance and Sexual Activity  . Alcohol use: No    Alcohol/week: 0.0 standard drinks    Comment: pt states no alchohol since 01/13/14  . Drug use: No    Types: Marijuana    Comment: pt states no drugs since 01/13/14  . Sexual activity: Not Currently    Partners: Female  Other Topics Concern  . Not on file  Social History Narrative  . Not on file   Social Determinants of Health   Financial Resource Strain:   .  Difficulty of Paying Living Expenses: Not on file  Food Insecurity:   . Worried About Charity fundraiser in the Last Year: Not on file  . Ran Out of Food in the Last Year: Not on file  Transportation Needs:   . Lack of Transportation (Medical): Not on file  . Lack of Transportation (Non-Medical): Not on file  Physical Activity:   . Days of Exercise per Week: Not on file  . Minutes of Exercise per Session: Not on file  Stress:   . Feeling of Stress : Not on file  Social Connections:   . Frequency of Communication with Friends  and Family: Not on file  . Frequency of Social Gatherings with Friends and Family: Not on file  . Attends Religious Services: Not on file  . Active Member of Clubs or Organizations: Not on file  . Attends Archivist Meetings: Not on file  . Marital Status: Not on file   Family History  Problem Relation Age of Onset  . Bipolar disorder Other    History reviewed. No pertinent surgical history.    Vanessa Kick, MD 03/22/19 1042

## 2020-11-19 IMAGING — DX DG CHEST 1V PORT
1 series · 1 of 1 positions shown · non-contrast
Comparison: 09/23/2009

CLINICAL DATA: Overdose, shortness of breath

EXAM:
PORTABLE CHEST 1 VIEW

[chest ap]
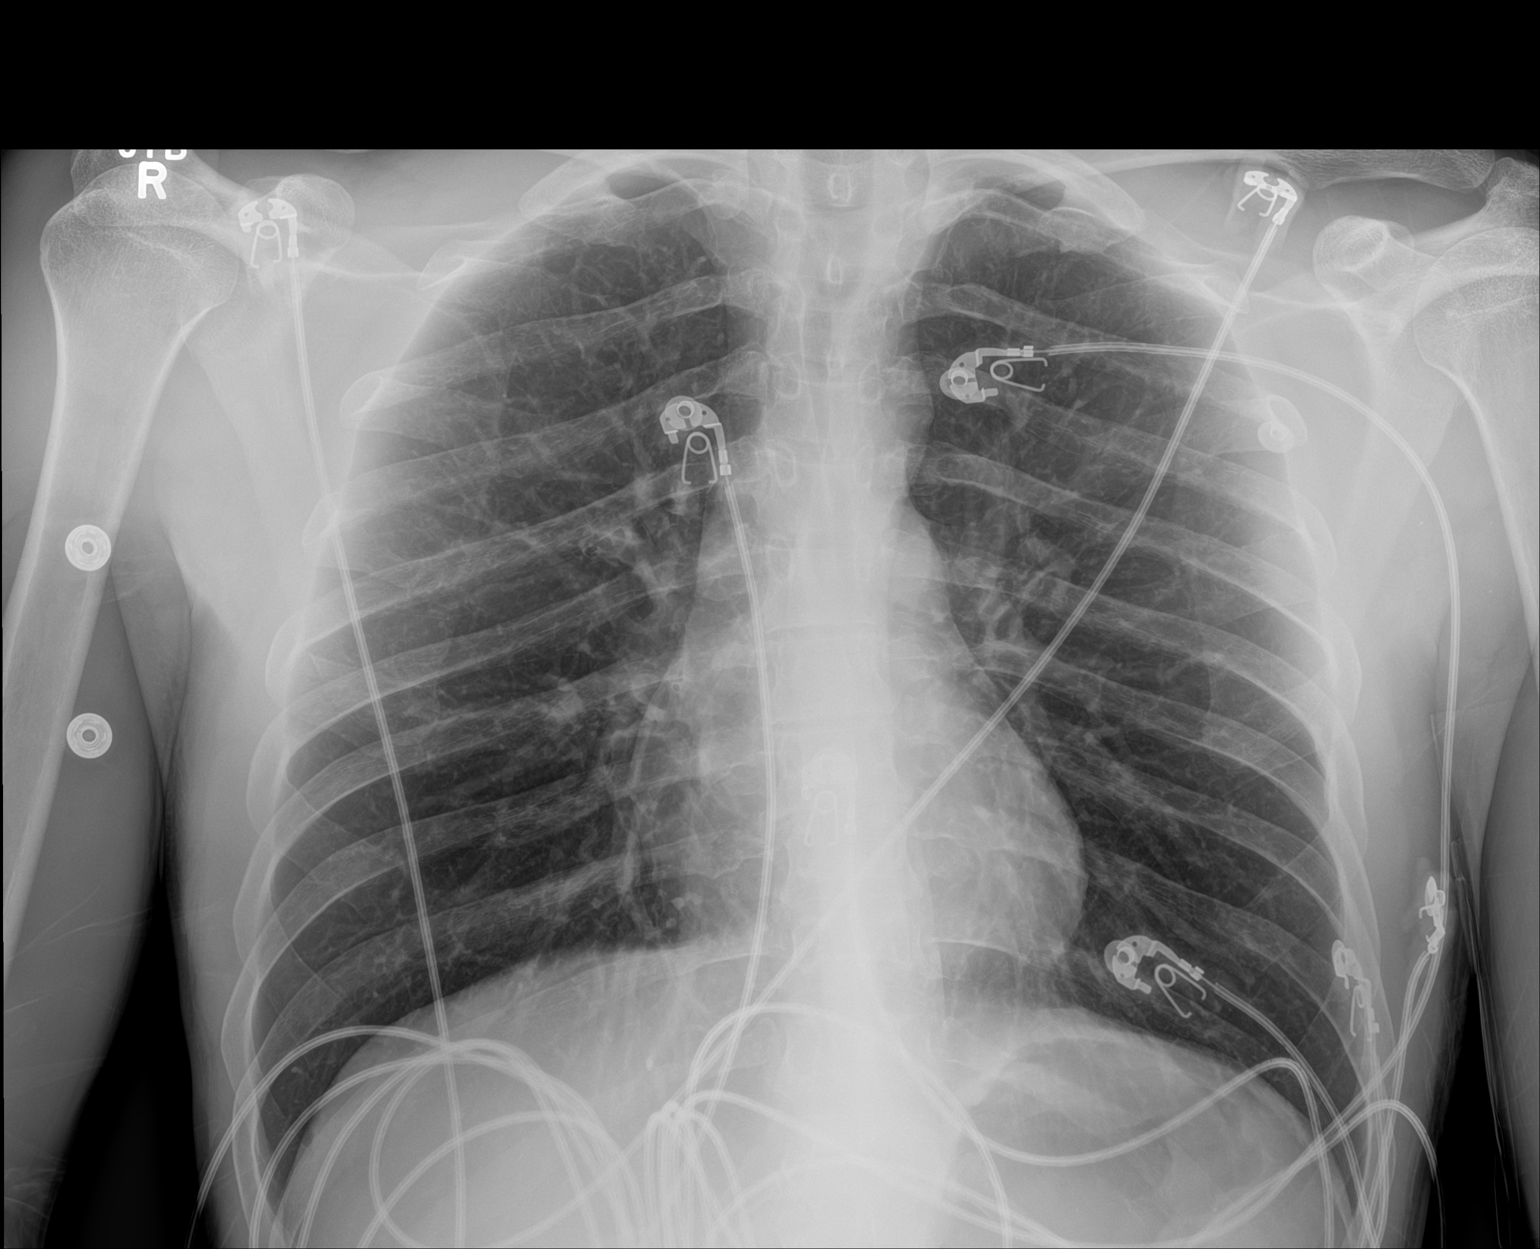

[1 of 1 positions shown; findings below may reference images not displayed]

FINDINGS: The heart size and mediastinal contours are within normal limits.
Both lungs are clear. The visualized skeletal structures are
unremarkable.
IMPRESSION: No acute abnormality of the lungs in AP portable projection.

## 2021-10-19 ENCOUNTER — Encounter (HOSPITAL_COMMUNITY): Payer: Self-pay | Admitting: Emergency Medicine

## 2021-10-19 ENCOUNTER — Emergency Department (HOSPITAL_COMMUNITY)
Admission: EM | Admit: 2021-10-19 | Discharge: 2021-10-19 | Disposition: A | Discharge status: Home / Self Care | Payer: No Typology Code available for payment source | Attending: Emergency Medicine | Admitting: Emergency Medicine

## 2021-10-19 DIAGNOSIS — F1420 Cocaine dependence, uncomplicated: Secondary | ICD-10-CM | POA: Insufficient documentation

## 2021-10-19 DIAGNOSIS — F1920 Other psychoactive substance dependence, uncomplicated: Secondary | ICD-10-CM

## 2021-10-19 NOTE — Discharge Instructions (Signed)
You were seen today requesting resources for assistance with substance abuse.  I have provided the resource guides for both outpatient and residential substance abuse facilities in the area.  Please contact the facilities listed to find a facility with the ability to assist you.

## 2021-10-19 NOTE — ED Provider Notes (Signed)
  [facility] EMERGENCY DEPARTMENT Provider Note   CSN: [id number] Arrival date & time: 10/19/21  1646     History  Chief Complaint  Patient presents with   Detox    Vincent Cunningham is a 36 y.o. male.  Patient presents to the hospital with a chief complaint of needing resources for outpatient drug counseling.  Patient states he has been using cocaine intermittently since he was 35 years old.  He is requesting resources for inpatient detox programs.  He has no other complaints at this time  HPI     Home Medications Prior to Admission medications   Medication Sig Start Date End Date Taking? Authorizing Provider  amoxicillin-clavulanate (AUGMENTIN) 875-125 MG tablet Take 1 tablet by mouth every 12 (twelve) hours. 03/21/19   Mardella Layman, MD      Allergies    Patient has no known allergies.    Review of Systems   Review of Systems Negative except as noted in HPI Physical Exam Updated Vital Signs BP (!) 143/105 (BP Location: Right Arm)   Pulse (!) 125   Temp 98.3 F (36.8 C) (Oral)   Resp 18   SpO2 99%  Physical Exam Vitals and nursing note reviewed.  Constitutional:      General: He is not in acute distress.    Appearance: He is normal weight.  HENT:     Head: Normocephalic and atraumatic.  Eyes:     Conjunctiva/sclera: Conjunctivae normal.  Cardiovascular:     Rate and Rhythm: Normal rate.  Pulmonary:     Effort: Pulmonary effort is normal.  Musculoskeletal:        General: Normal range of motion.     Cervical back: Normal range of motion and neck supple.  Skin:    General: Skin is warm and dry.  Neurological:     Mental Status: He is alert and oriented to person, place, and time.  Psychiatric:        Mood and Affect: Mood normal.        Behavior: Behavior normal.     ED Results / Procedures / Treatments   Labs (all labs ordered are listed, but only abnormal results are displayed) Labs Reviewed - No data to  display  EKG None  Radiology No results found.  Procedures Procedures    Medications Ordered in ED Medications - No data to display  ED Course/ Medical Decision Making/ A&P                           Medical Decision Making  Patient presents requesting resources for outpatient detox centers.  He has no complaints that require emergent intervention at this time.  He has no complaints of withdrawal symptoms, seizures, headache, abdominal pain, nausea, vomiting.  I will discharge the patient home and provide him with the resource guide for outpatient treatment centers.        Final Clinical Impression(s) / ED Diagnoses Final diagnoses:  Addiction to drug [facility])    Rx / DC Orders ED Discharge Orders     None         Pamala Duffel 10/19/21 1732    Ernie Avena, MD 10/19/21 2159

## 2021-10-19 NOTE — ED Triage Notes (Signed)
Patient here requesting help detox'ing from cocaine. Patient denies SI/HI/AVH.

## 2021-10-19 NOTE — ED Notes (Signed)
Pt called for triage, no answer

## 2021-10-20 ENCOUNTER — Emergency Department (HOSPITAL_COMMUNITY)
Admission: EM | Admit: 2021-10-20 | Discharge: 2021-10-20 | Disposition: A | Discharge status: Other Acute Inpatient Hospital | Payer: No Typology Code available for payment source | Attending: Emergency Medicine | Admitting: Emergency Medicine

## 2021-10-20 ENCOUNTER — Other Ambulatory Visit (HOSPITAL_COMMUNITY)
Admission: EM | Admit: 2021-10-20 | Discharge: 2021-10-22 | Disposition: A | Discharge status: Home / Self Care | Payer: No Payment, Other | Attending: Psychiatry | Admitting: Psychiatry

## 2021-10-20 DIAGNOSIS — F411 Generalized anxiety disorder: Secondary | ICD-10-CM | POA: Insufficient documentation

## 2021-10-20 DIAGNOSIS — Z20822 Contact with and (suspected) exposure to covid-19: Secondary | ICD-10-CM | POA: Insufficient documentation

## 2021-10-20 DIAGNOSIS — F1410 Cocaine abuse, uncomplicated: Secondary | ICD-10-CM

## 2021-10-20 DIAGNOSIS — F1910 Other psychoactive substance abuse, uncomplicated: Secondary | ICD-10-CM | POA: Diagnosis present

## 2021-10-20 DIAGNOSIS — F3131 Bipolar disorder, current episode depressed, mild: Secondary | ICD-10-CM | POA: Diagnosis not present

## 2021-10-20 DIAGNOSIS — F1320 Sedative, hypnotic or anxiolytic dependence, uncomplicated: Secondary | ICD-10-CM | POA: Insufficient documentation

## 2021-10-20 DIAGNOSIS — Z79899 Other long term (current) drug therapy: Secondary | ICD-10-CM | POA: Insufficient documentation

## 2021-10-20 DIAGNOSIS — F17210 Nicotine dependence, cigarettes, uncomplicated: Secondary | ICD-10-CM | POA: Insufficient documentation

## 2021-10-20 DIAGNOSIS — F1914 Other psychoactive substance abuse with psychoactive substance-induced mood disorder: Secondary | ICD-10-CM | POA: Insufficient documentation

## 2021-10-20 DIAGNOSIS — F1911 Other psychoactive substance abuse, in remission: Secondary | ICD-10-CM | POA: Diagnosis not present

## 2021-10-20 DIAGNOSIS — F17290 Nicotine dependence, other tobacco product, uncomplicated: Secondary | ICD-10-CM | POA: Insufficient documentation

## 2021-10-20 DIAGNOSIS — R45851 Suicidal ideations: Secondary | ICD-10-CM | POA: Insufficient documentation

## 2021-10-20 DIAGNOSIS — F1490 Cocaine use, unspecified, uncomplicated: Secondary | ICD-10-CM

## 2021-10-20 LAB — CBC
HCT: 42.1 % (ref 39.0–52.0)
Hemoglobin: 15 g/dL (ref 13.0–17.0)
MCH: 33 pg (ref 26.0–34.0)
MCHC: 35.6 g/dL (ref 30.0–36.0)
MCV: 92.7 fL (ref 80.0–100.0)
Platelets: 216 10*3/uL (ref 150–400)
RBC: 4.54 MIL/uL (ref 4.22–5.81)
RDW: 12.2 % (ref 11.5–15.5)
WBC: 6.1 10*3/uL (ref 4.0–10.5)
nRBC: 0 % (ref 0.0–0.2)

## 2021-10-20 LAB — COMPREHENSIVE METABOLIC PANEL
ALT: 46 U/L — ABNORMAL HIGH (ref 0–44)
AST: 41 U/L (ref 15–41)
Albumin: 4.1 g/dL (ref 3.5–5.0)
Alkaline Phosphatase: 60 U/L (ref 38–126)
Anion gap: 6 (ref 5–15)
BUN: 12 mg/dL (ref 6–20)
CO2: 31 mmol/L (ref 22–32)
Calcium: 9.3 mg/dL (ref 8.9–10.3)
Chloride: 103 mmol/L (ref 98–111)
Creatinine, Ser: 0.92 mg/dL (ref 0.61–1.24)
GFR, Estimated: >60 mL/min (ref >60)
Glucose, Bld: 70 mg/dL (ref 70–99)
Potassium: 4 mmol/L (ref 3.5–5.1)
Sodium: 140 mmol/L (ref 135–145)
Total Bilirubin: 0.6 mg/dL (ref 0.3–1.2)
Total Protein: 7 g/dL (ref 6.5–8.1)

## 2021-10-20 LAB — ETHANOL: Alcohol, Ethyl (B): <10 mg/dL (ref <10)

## 2021-10-20 LAB — RAPID URINE DRUG SCREEN, HOSP PERFORMED
Amphetamines: NOT DETECTED
Barbiturates: NOT DETECTED
Benzodiazepines: POSITIVE — AB
Cocaine: POSITIVE — AB
Opiates: NOT DETECTED
Tetrahydrocannabinol: POSITIVE — AB

## 2021-10-20 LAB — RESP PANEL BY RT-PCR (FLU A&B, COVID) ARPGX2
Influenza A by PCR: NEGATIVE
Influenza B by PCR: NEGATIVE
SARS Coronavirus 2 by RT PCR: NEGATIVE

## 2021-10-20 LAB — ACETAMINOPHEN LEVEL: Acetaminophen (Tylenol), Serum: <10 ug/mL — ABNORMAL LOW (ref 10–30)

## 2021-10-20 LAB — SALICYLATE LEVEL: Salicylate Lvl: <7 mg/dL — ABNORMAL LOW (ref 7.0–30.0)

## 2021-10-20 MED ORDER — TRAZODONE HCL 50 MG PO TABS: 50.0000 mg | ORAL_TABLET | Freq: Every evening | ORAL | Status: DC | PRN

## 2021-10-20 MED ORDER — HYDROXYZINE HCL 25 MG PO TABS
25.0000 mg | ORAL_TABLET | Freq: Three times a day (TID) | ORAL | Status: DC | PRN
  Administered 2021-10-21 – 2021-10-22 (×3): 25 mg via ORAL
  Filled 2021-10-20 (×3): qty 1

## 2021-10-20 MED ORDER — MAGNESIUM HYDROXIDE 400 MG/5ML PO SUSP: 30.0000 mL | Freq: Every day | ORAL | Status: DC | PRN

## 2021-10-20 MED ORDER — ACETAMINOPHEN 325 MG PO TABS: 650.0000 mg | ORAL_TABLET | Freq: Four times a day (QID) | ORAL | Status: DC | PRN

## 2021-10-20 MED ORDER — NICOTINE 14 MG/24HR TD PT24
14.0000 mg | MEDICATED_PATCH | Freq: Once | TRANSDERMAL | Status: DC
  Administered 2021-10-21: 14 mg via TRANSDERMAL
  Filled 2021-10-20: qty 1

## 2021-10-20 MED ORDER — ALUM & MAG HYDROXIDE-SIMETH 200-200-20 MG/5ML PO SUSP: 30.0000 mL | ORAL | Status: DC | PRN

## 2021-10-20 MED ORDER — FLUOXETINE HCL 10 MG PO CAPS: 10.0000 mg | ORAL_CAPSULE | Freq: Every day | ORAL | Status: DC

## 2021-10-20 MED ORDER — HYDROXYZINE HCL 25 MG PO TABS: 25.0000 mg | ORAL_TABLET | Freq: Three times a day (TID) | ORAL | Status: DC | PRN

## 2021-10-20 NOTE — ED Provider Triage Note (Signed)
Emergency Medicine Provider Triage Evaluation Note  Vincent Cunningham , a 36 y.o. male  was evaluated in triage.  Pt complains of suicidal ideation.  Patient was seen here yesterday for crack cocaine abuse and was requesting placement for detox facility.  He was discharged home with resources, and he states that he called all of these facilities but they were unable to get him in for several days.  He "cracked a cane last night at 2300.  When asked that he has a plan to harm himself, he says that he would inject large amounts of fentanyl to end his life.  He states that he does not truly want to do this due to his 86-year-old son, however he is feeling hopeless.  Denies homicidal intent, visual or auditory hallucinations.  No previous history of suicide attempt.  Review of Systems  Positive:  Negative:   Physical Exam  BP 118/71   Pulse 86   Temp (!) 97.5 F (36.4 C)   Resp 16   SpO2 100%  Gen:   Awake, no distress   Resp:  Normal effort  MSK:   Moves extremities without difficulty  Other:    Medical Decision Making  Medically screening exam initiated at 2:52 PM.  Appropriate orders placed.  Adalberto Metzgar was informed that the remainder of the evaluation will be completed by another provider, this initial triage assessment does not replace that evaluation, and the importance of remaining in the ED until their evaluation is complete.     Janell Quiet, [place] 10/20/21 1454

## 2021-10-20 NOTE — Consult Note (Signed)
[facility] ED ASSESSMENT   Reason for Consult:  SI Referring Physician:  Dr. Rubin Payor Patient Identification: Vincent Cunningham MRN:  [id number] ED Chief Complaint: Suicidal ideation  Diagnosis:  Principal Problem:   Suicidal ideation Active Problems:   Cocaine abuse [facility])   ED Assessment Time Calculation: Start Time: 1800 Stop Time: 1830 Total Time in Minutes (Assessment Completion): 30   Subjective:   Vincent Cunningham is a 36 y.o. male patient who originally presented yesterday to [facility] ED for relapsing on cocaine and seeking treatment.  Patient was discharged with resources, he followed up with multiple facilities but no one had bed availability.  Patient is now back at [facility] ED with suicidal ideations and is still hoping to receive substance abuse treatment.  HPI:   Patient seen in his room at [facility] ED for face-to-face evaluation.  Patient tells me he has been sober for around 2 years until a few months ago when he started using substances again.  Patient mentions sometimes he will use opiates and benzos but recently it has mostly been cocaine.  Patient stated his family found out and his parents are requiring him to get treatment before they allow him back in their house.  He lives with his parents and his 71-year-old son also lives in the home.  His parents help raise his son and they are keeping him while he is in the hospital.  He is not able to identify any specific triggers that caused him to relapse.  Patient stated he is very disappointed in himself and is feeling suicidal.  Denies plan or intent.  Talks about protective factors such as his son and his family.  He denies any homicidal ideations.  Denies any auditory or visual hallucinations.  He does have history of 2 previous suicide attempts.  Both of them were overdoses on opiates years ago.  He tells me he used to take Prozac in the past and would like to restart it.  He denies problems with sleep when he is not using drugs.  Denies problems with  appetite.  Patient is tearful during our conversation and states he wants to be sober and clean again for the sake of his son.  Patient stated he would not feel safe leaving this hospital and is unable to contract for safety at this time.  Patient is able to engage in coherent and logical conversation.  No signs of Psychosis, does not appear to be responding to internal stimuli.  Speech is normal in rate and tone.  Thought content intact. There is availability in the [facility].  Vernard Gambles, NP has reviewed patient and accepted him for [facility].  Patient is agreeable with transfer to [facility]. EDP and RN notified of disposition.    Past Psychiatric History:  Reported history of depression and polysubstance abuse.  Risk to Self or Others: Is the patient at risk to self? Yes Has the patient been a risk to self in the past 6 months? No Has the patient been a risk to self within the distant past? No Is the patient a risk to others? No Has the patient been a risk to others in the past 6 months? No Has the patient been a risk to others within the distant past? No  [place] Scale:  Flowsheet Row ED from 10/20/2021 in [facility] EMERGENCY DEPARTMENT ED from 10/19/2021 in [facility] EMERGENCY DEPARTMENT  C-SSRS RISK CATEGORY High Risk No Risk        Past  Medical History:  Past Medical History:  Diagnosis Date   Anxiety    No past surgical history on file. Family History:  Family History  Problem Relation Age of Onset   Bipolar disorder Other     Social History:  Social History   Substance and Sexual Activity  Alcohol Use No   Alcohol/week: 0.0 standard drinks of alcohol   Comment: pt states no alchohol since 01/13/14     Social History   Substance and Sexual Activity  Drug Use No   Types: Marijuana   Comment: pt states no drugs since 01/13/14    Social History   Socioeconomic History   Marital status: Single    Spouse name: Not on file   Number  of children: Not on file   Years of education: Not on file   Highest education level: Not on file  Occupational History   Not on file  Tobacco Use   Smoking status: Every Day    Packs/day: 0.50    Types: Cigarettes    Start date: 03/15/1998   Smokeless tobacco: Never  Substance and Sexual Activity   Alcohol use: No    Alcohol/week: 0.0 standard drinks of alcohol    Comment: pt states no alchohol since 01/13/14   Drug use: No    Types: Marijuana    Comment: pt states no drugs since 01/13/14   Sexual activity: Not Currently    Partners: Female  Other Topics Concern   Not on file  Social History Narrative   Not on file   Social Determinants of Health   Financial Resource Strain: Not on file  Food Insecurity: Not on file  Transportation Needs: Not on file  Physical Activity: Not on file  Stress: Not on file  Social Connections: Not on file   Additional Social History:    Allergies:  No Known Allergies  Labs:  Results for orders placed or performed during the hospital encounter of 10/20/21 (from the past 48 hour(s))  Comprehensive metabolic panel     Status: Abnormal   Collection Time: 10/20/21  2:40 PM  Result Value Ref Range   Sodium 140 135 - 145 mmol/L   Potassium 4.0 3.5 - 5.1 mmol/L   Chloride 103 98 - 111 mmol/L   CO2 31 22 - 32 mmol/L   Glucose, Bld 70 70 - 99 mg/dL    Comment: Glucose reference range applies only to samples taken after fasting for at least 8 hours.   BUN 12 6 - 20 mg/dL   Creatinine, Ser 6.29 0.61 - 1.24 mg/dL   Calcium 9.3 8.9 - 52.8 mg/dL   Total Protein 7.0 6.5 - 8.1 g/dL   Albumin 4.1 3.5 - 5.0 g/dL   AST 41 15 - 41 U/L   ALT 46 (H) 0 - 44 U/L   Alkaline Phosphatase 60 38 - 126 U/L   Total Bilirubin 0.6 0.3 - 1.2 mg/dL   GFR, Estimated >41 >32 mL/min    Comment: (NOTE) Calculated using the CKD-EPI Creatinine Equation (2021)    Anion gap 6 5 - 15    Comment: Performed at [facility], [address] [place], [place] [address]   Ethanol     Status: None   Collection Time: 10/20/21  2:40 PM  Result Value Ref Range   Alcohol, Ethyl (B) <10 <10 mg/dL    Comment: (NOTE) Lowest detectable limit for serum alcohol is 10 mg/dL.  For medical purposes only. Performed at [facility]  Lab, [address], [place], [place] [address]   Salicylate level     Status: Abnormal   Collection Time: 10/20/21  2:40 PM  Result Value Ref Range   Salicylate Lvl <7.0 (L) 7.0 - 30.0 mg/dL    Comment: Performed at [facility], [address], [place], [place] [address]  Acetaminophen level     Status: Abnormal   Collection Time: 10/20/21  2:40 PM  Result Value Ref Range   Acetaminophen (Tylenol), Serum <10 (L) 10 - 30 ug/mL    Comment: (NOTE) Therapeutic concentrations vary significantly. A range of 10-30 ug/mL  may be an effective concentration for many patients. However, some  are best treated at concentrations outside of this range. Acetaminophen concentrations >150 ug/mL at 4 hours after ingestion  and >50 ug/mL at 12 hours after ingestion are often associated with  toxic reactions.  Performed at [facility], [address], [place], [place] [address]   cbc     Status: None   Collection Time: 10/20/21  2:40 PM  Result Value Ref Range   WBC 6.1 4.0 - 10.5 K/uL   RBC 4.54 4.22 - 5.81 MIL/uL   Hemoglobin 15.0 13.0 - 17.0 g/dL   HCT 97.6 73.4 - 19.3 %   MCV 92.7 80.0 - 100.0 fL   MCH 33.0 26.0 - 34.0 pg   MCHC 35.6 30.0 - 36.0 g/dL   RDW 79.0 24.0 - 97.3 %   Platelets 216 150 - 400 K/uL   nRBC 0.0 0.0 - 0.2 %    Comment: Performed at [facility], [address] [place], [place] [address]  Rapid urine drug screen (hospital performed)     Status: Abnormal   Collection Time: 10/20/21  2:40 PM  Result Value Ref Range   Opiates NONE DETECTED NONE DETECTED   Cocaine POSITIVE (A) NONE DETECTED   Benzodiazepines POSITIVE (A) NONE DETECTED   Amphetamines NONE DETECTED NONE DETECTED    Tetrahydrocannabinol POSITIVE (A) NONE DETECTED   Barbiturates NONE DETECTED NONE DETECTED    Comment: (NOTE) DRUG SCREEN FOR MEDICAL PURPOSES ONLY.  IF CONFIRMATION IS NEEDED FOR ANY PURPOSE, NOTIFY LAB WITHIN 5 DAYS.  LOWEST DETECTABLE LIMITS FOR URINE DRUG SCREEN Drug Class                     Cutoff (ng/mL) Amphetamine and metabolites    1000 Barbiturate and metabolites    200 Benzodiazepine                 200 Tricyclics and metabolites     300 Opiates and metabolites        300 Cocaine and metabolites        300 THC                            50 Performed at [facility], [address], [place], [place] [address]     No current facility-administered medications for this encounter.   Current Outpatient Medications  Medication Sig Dispense Refill   amoxicillin-clavulanate (AUGMENTIN) 875-125 MG tablet Take 1 tablet by mouth every 12 (twelve) hours. 20 tablet 0   Psychiatric Specialty Exam: Presentation  General Appearance: Appropriate for Environment  Eye Contact:Good  Speech:Clear and Coherent  Speech Volume:Normal  Handedness:No data recorded  Mood and Affect  Mood:Depressed  Affect:Congruent   Thought Process  Thought Processes:Coherent  Descriptions of Associations:Intact  Orientation:Full (  Time, Place and Person)  Thought Content:Logical  History of Schizophrenia/Schizoaffective disorder:No data recorded Duration of Psychotic Symptoms:No data recorded Hallucinations:Hallucinations: None  Ideas of Reference:None  Suicidal Thoughts:Suicidal Thoughts: Yes, Active SI Active Intent and/or Plan: Without Intent; Without Plan  Homicidal Thoughts:Homicidal Thoughts: No   Sensorium  Memory:Immediate Good  Judgment:Fair  Insight:Fair   Executive Functions  Concentration:Good  Attention Span:Good  Recall:Good  Fund of Knowledge:Good  Language:Good   Psychomotor Activity  Psychomotor Activity:Psychomotor Activity:  Normal   Assets  Assets:Communication Skills; Desire for Improvement; Physical Health; Resilience; Social Support    Sleep  Sleep:Sleep: Fair   Physical Exam: Physical Exam Neurological:     Mental Status: He is alert and oriented to person, place, and time.    Review of Systems  Psychiatric/Behavioral:  Positive for depression, substance abuse and suicidal ideas.   All other systems reviewed and are negative.  Blood pressure (!) 130/90, pulse 77, temperature 98 F (36.7 C), temperature source Oral, resp. rate 18, SpO2 100 %. There is no height or weight on file to calculate BMI.  Medical Decision Making: Patient case reviewed and discussed with Dr. Lucianne Muss.  Patient will be transferred to [facility] for further detox and stabilization. EDP and RN updated of disposition.   Problem 1: Depression Start Prozac 10 mg po daily   Disposition:  Transfer to [facility]  Eligha Bridegroom, NP 10/20/2021 6:20 PM

## 2021-10-20 NOTE — ED Provider Notes (Signed)
Facility Based Crisis Admission H&P  Date: 10/21/21 Patient Name: Vincent Cunningham MRN: [id number] Chief Complaint: " I want to detox".     Diagnoses:  Final diagnoses:  Polysubstance abuse ([place])  Crack cocaine use  Bipolar affective disorder, currently depressed, mild (HCC)  Xanax use disorder, severe, dependence ([place])    HPI: Vincent Cunningham is a 36 year old male with psychiatric history of Polysubstance abuse, Opiate abuse, benzo abuse, crack cocaine abuse, suicidal ideation, and Bipolar depression, who presented to [facility] by [facility] as a transfer from [facility] for detox/substance abuse treatment.   Per chart review, the Pt initially presented to [facility] on 10/19/21 with complaints of needing outpatient drug counseling resources for cocaine use and in-patient detox. Pt denied symptoms of withdrawal seizures, headache, abdominal pain nausea or vomiting.  Pt was discharged home same day with outpatient treatment resources.  Per chart review, on 10/20/21, the patient returned to [facility] "stating he was not able to get anywhere with the resources provided to him, and with complaints of more being more depressed with suicidal thoughts with thoughts of overdosing on fentanyl, but reports he would not do it because he has a 61 year old son. Pt also has an upcoming court date over a month from now".   On evaluation, pt is lying on the couch and pleasant. Pt reports " well, I really want cigarettes right now". Pt reports he smokes less than a pack a day, and also Vapes.   Pt reports he has been clean up to a few months ago when he relapsed and started using crack cocaine daily, sometimes 2-3 days in between for the past few months. Pt endorses use of Xanax pills up to 4-5 mg daily, but denies using the past couple of months. Pt endorses alcohol use stating " not very much, I take less than 3 drinks a week".  Pt reports " I haven't really done any opiates, cause that would be the way I would have gone" . " I've already  overdosed 3 times on it in the past". " I came here because I really don't want to, and my 35 year old needs me".  Pt denies SI/HI/AVH, and reports feeling "just sad with tears in his eyes".  Pt reports he lives with his dad, son and step mom, and sometimes stay with his fiancee who lives with her sister. Pt denies acces or means to firearms.  Pt reports his sleep and appetite as fair. Pt reports she does not see a psychiatrist or therapist and is not currently taking any psychiatric medications.  Pt reports 2 psychiatric hospitalizations related to overdose initially when he was 63 and the second attempt 3 years ago. Pt reports he was placed in rehab center for Detox 8 years ago ([facility] in [place]). Pt denies history of withdrawal seizures.   Pt reports in the past, he was trialed on Prozac for his Bipolar depression, "but I didn't take it long enough for it to work, I need an antianxiety medication".   Support, encouragement and reassurance provided about ongoing stressors. Pt provided with opportunity for questions.   On evaluation, patient is alert, oriented x 4, and cooperative. Speech is clear, coherent and logical. Pt appears casual. Eye contact is good. Mood is depressed and tearful, affect is congruent with mood. Thought process is logical and thought content is coherent. Pt denies SI/HI/AVH. There is no indication that the patient is responding to internal stimuli. No delusions elicited during this assessment.  PHQ 2-9:  [place] ED from 10/20/2021 in [facility]  Thoughts that you would be better off dead, or of hurting yourself in some way Several days  PHQ-9 Total Score 11       [place] ED from 10/20/2021 in [facility] Most recent reading at 10/21/2021  1:29 AM ED from 10/20/2021 in [place] Most recent reading at 10/20/2021  2:40 PM ED from 10/19/2021 in [place] Most recent reading at 10/19/2021  5:31 PM  C-SSRS RISK CATEGORY High Risk High Risk No Risk        Total Time spent with patient: 20 minutes  Musculoskeletal  Strength & Muscle Tone: within normal limits Gait & Station: normal Patient leans: N/A  Psychiatric Specialty Exam  Presentation General Appearance: Casual  Eye Contact:Good  Speech:Clear and Coherent  Speech Volume:Normal  Handedness:Right   Mood and Affect  Mood:Depressed  Affect:Congruent; Tearful   Thought Process  Thought Processes:Coherent  Descriptions of Associations:Intact  Orientation:Full (Time, Place and Person)  Thought Content:Logical    Hallucinations:Hallucinations: None  Ideas of Reference:None  Suicidal Thoughts:Suicidal Thoughts: No SI Active Intent and/or Plan: Without Intent; Without Plan  Homicidal Thoughts:Homicidal Thoughts: No   Sensorium  Memory:Immediate Good; Recent Good  Judgment:Fair  Insight:Fair   Executive Functions  Concentration:Good  Attention Span:Good  Recall:Good  Fund of Knowledge:Good  Language:Good   Psychomotor Activity  Psychomotor Activity:Psychomotor Activity: Normal   Assets  Assets:Communication Skills; Desire for Improvement; Housing; Intimacy; Social Support   Sleep  Sleep:Sleep: Fair   Nutritional Assessment (For OBS and FBC admissions only) Has the patient had a weight loss or gain of 10 pounds or more in the last 3 months?: No Has the patient had a decrease in food intake/or appetite?: No Does the patient have dental problems?: No Does the patient have eating habits or behaviors that may be indicators of an eating disorder including binging or inducing vomiting?: No Has the patient recently lost weight without trying?: 0 Has the patient been eating poorly because of a decreased appetite?: 0 Malnutrition Screening Tool Score: 0    Physical Exam Constitutional:      General: He  is not in acute distress.    Appearance: He is not diaphoretic.  HENT:     Head: Normocephalic.     Right Ear: External ear normal.     Left Ear: External ear normal.     Nose: No congestion.  Eyes:     General:        Right eye: No discharge.        Left eye: No discharge.  Cardiovascular:     Rate and Rhythm: Normal rate.  Pulmonary:     Effort: No respiratory distress.  Chest:     Chest wall: No tenderness.  Neurological:     Mental Status: He is alert and oriented to person, place, and time.  Psychiatric:        Attention and Perception: Attention and perception normal.        Mood and Affect: Mood is anxious and depressed. Affect is tearful.        Speech: Speech normal.        Behavior: Behavior is cooperative.        Thought Content: Thought content normal. Thought content is not paranoid or delusional. Thought content does not include homicidal or suicidal ideation. Thought content does not include homicidal or  suicidal plan.        Cognition and Memory: Cognition and memory normal.        Judgment: Judgment normal.    Review of Systems  Constitutional:  Negative for diaphoresis, fever and weight loss.  HENT:  Negative for congestion.   Eyes:  Negative for discharge.  Respiratory:  Negative for cough and shortness of breath.   Cardiovascular:  Negative for chest pain and palpitations.  Gastrointestinal:  Negative for constipation, nausea and vomiting.  Neurological:  Negative for dizziness, seizures, loss of consciousness, weakness and headaches.  Psychiatric/Behavioral:  Positive for depression and substance abuse. Negative for hallucinations and suicidal ideas. The patient is nervous/anxious. The patient does not have insomnia.     Blood pressure 121/74, pulse 66, temperature 98.7 F (37.1 C), temperature source Oral, resp. rate 18, SpO2 100 %. There is no height or weight on file to calculate BMI.  Past Psychiatric History: See H & P  Is the patient at risk to  self? No  Has the patient been a risk to self in the past 6 months? No .    Has the patient been a risk to self within the distant past? No   Is the patient a risk to others? No   Has the patient been a risk to others in the past 6 months? No   Has the patient been a risk to others within the distant past? No   Past Medical History:  Past Medical History:  Diagnosis Date   Anxiety    No past surgical history on file.  Family History:  Family History  Problem Relation Age of Onset   Bipolar disorder Other     Social History:  Social History   Socioeconomic History   Marital status: Single    Spouse name: Not on file   Number of children: Not on file   Years of education: Not on file   Highest education level: Not on file  Occupational History   Not on file  Tobacco Use   Smoking status: Every Day    Packs/day: 0.50    Types: Cigarettes    Start date: 03/15/1998   Smokeless tobacco: Never  Substance and Sexual Activity   Alcohol use: No    Alcohol/week: 0.0 standard drinks of alcohol    Comment: pt states no alchohol since 01/13/14   Drug use: No    Types: Marijuana    Comment: pt states no drugs since 01/13/14   Sexual activity: Not Currently    Partners: Female  Other Topics Concern   Not on file  Social History Narrative   Not on file   Social Determinants of Health   Financial Resource Strain: Not on file  Food Insecurity: Not on file  Transportation Needs: Not on file  Physical Activity: Not on file  Stress: Not on file  Social Connections: Not on file  Intimate Partner Violence: Not on file    SDOH:  SDOH Screenings   Alcohol Screen: Not on file  Depression (PHQ2-9): High Risk (10/21/2021)   Depression (PHQ2-9)    PHQ-2 Score: 11  Financial Resource Strain: Not on file  Food Insecurity: Not on file  Housing: Not on file  Physical Activity: Not on file  Social Connections: Not on file  Stress: Not on file  Tobacco Use: High Risk (10/19/2021)    Patient History    Smoking Tobacco Use: Every Day    Smokeless Tobacco Use: Never    Passive Exposure:  Not on file  Transportation Needs: Not on file    Last Labs:  Admission on 10/20/2021  Component Date Value Ref Range Status   Hgb A1c MFr Bld 10/20/2021 5.0  4.8 - 5.6 % Final   Comment: (NOTE) Pre diabetes:          5.7%-6.4%  Diabetes:              >6.4%  Glycemic control for   <7.0% adults with diabetes    Mean Plasma Glucose 10/20/2021 96.8  mg/dL Final   Performed at [facility] [address], [place], [place] [address]   Cholesterol 10/20/2021 135  0 - 200 mg/dL Final   Triglycerides 10/20/2021 142  <150 mg/dL Final   HDL 10/20/2021 49  >40 mg/dL Final   Total CHOL/HDL Ratio 10/20/2021 2.8  RATIO Final   VLDL 10/20/2021 28  0 - 40 mg/dL Final   LDL Cholesterol 10/20/2021 58  0 - 99 mg/dL Final   Comment:        Total Cholesterol/HDL:CHD Risk Coronary Heart Disease Risk Table                     Men   Women  1/2 Average Risk   3.4   3.3  Average Risk       5.0   4.4  2 X Average Risk   9.6   7.1  3 X Average Risk  23.4   11.0        Use the calculated Patient Ratio above and the CHD Risk Table to determine the patient's CHD Risk.        ATP III CLASSIFICATION (LDL):  <100     mg/dL   Optimal  100-129  mg/dL   Near or Above                    Optimal  130-159  mg/dL   Borderline  160-189  mg/dL   High  >190     mg/dL   Very High Performed at [facility] [address] [place], [place] [address]   Admission on 10/20/2021, Discharged on 10/20/2021  Component Date Value Ref Range Status   Sodium 10/20/2021 140  135 - 145 mmol/L Final   Potassium 10/20/2021 4.0  3.5 - 5.1 mmol/L Final   Chloride 10/20/2021 103  98 - 111 mmol/L Final   CO2 10/20/2021 31  22 - 32 mmol/L Final   Glucose, Bld 10/20/2021 70  70 - 99 mg/dL Final   Glucose reference range applies only to samples taken after fasting for at least 8 hours.   BUN 10/20/2021 12  6 - 20  mg/dL Final   Creatinine, Ser 10/20/2021 0.92  0.61 - 1.24 mg/dL Final   Calcium 10/20/2021 9.3  8.9 - 10.3 mg/dL Final   Total Protein 10/20/2021 7.0  6.5 - 8.1 g/dL Final   Albumin 10/20/2021 4.1  3.5 - 5.0 g/dL Final   AST 10/20/2021 41  15 - 41 U/L Final   ALT 10/20/2021 46 (H)  0 - 44 U/L Final   Alkaline Phosphatase 10/20/2021 60  38 - 126 U/L Final   Total Bilirubin 10/20/2021 0.6  0.3 - 1.2 mg/dL Final   GFR, Estimated 10/20/2021 >60  >60 mL/min Final   Comment: (NOTE) Calculated using the CKD-EPI Creatinine Equation (2021)    Anion gap 10/20/2021 6  5 - 15 Final   Performed at [facility], [address], [place], [place] [address]   Alcohol, Ethyl (B) 10/20/2021 <10  <10 mg/dL Final   Comment: (NOTE) Lowest detectable limit for serum alcohol is 10 mg/dL.  For medical purposes only. Performed at [facility], [place] [address], [place], [place] [id number]    Salicylate Lvl 123XX123 <7.0 (L)  7.0 - 30.0 mg/dL Final   Performed at [facility] [address], [place], [place] [address]   Acetaminophen (Tylenol), Serum 10/20/2021 <10 (L)  10 - 30 ug/mL Final   Comment: (NOTE) Therapeutic concentrations vary significantly. A range of 10-30 ug/mL  may be an effective concentration for many patients. However, some  are best treated at concentrations outside of this range. Acetaminophen concentrations >150 ug/mL at 4 hours after ingestion  and >50 ug/mL at 12 hours after ingestion are often associated with  toxic reactions.  Performed at [facility], [place] [address] [place], [place] [address]    WBC 10/20/2021 6.1  4.0 - 10.5 K/uL Final   RBC 10/20/2021 4.54  4.22 - 5.81 MIL/uL Final   Hemoglobin 10/20/2021 15.0  13.0 - 17.0 g/dL Final   HCT 10/20/2021 42.1  39.0 - 52.0 % Final   MCV 10/20/2021 92.7  80.0 - 100.0 fL Final   MCH 10/20/2021 33.0  26.0 - 34.0 pg Final   MCHC 10/20/2021 35.6  30.0 - 36.0 g/dL Final   RDW 10/20/2021 12.2  11.5 - 15.5  % Final   Platelets 10/20/2021 216  150 - 400 K/uL Final   nRBC 10/20/2021 0.0  0.0 - 0.2 % Final   Performed at [facility], [place] [address], [place], [place] [address]   Opiates 10/20/2021 NONE DETECTED  NONE DETECTED Final   Cocaine 10/20/2021 POSITIVE (A)  NONE DETECTED Final   Benzodiazepines 10/20/2021 POSITIVE (A)  NONE DETECTED Final   Amphetamines 10/20/2021 NONE DETECTED  NONE DETECTED Final   Tetrahydrocannabinol 10/20/2021 POSITIVE (A)  NONE DETECTED Final   Barbiturates 10/20/2021 NONE DETECTED  NONE DETECTED Final   Comment: (NOTE) DRUG SCREEN FOR MEDICAL PURPOSES ONLY.  IF CONFIRMATION IS NEEDED FOR ANY PURPOSE, NOTIFY LAB WITHIN 5 DAYS.  LOWEST DETECTABLE LIMITS FOR URINE DRUG SCREEN Drug Class                     Cutoff (ng/mL) Amphetamine and metabolites    1000 Barbiturate and metabolites    200 Benzodiazepine                 [id number] Tricyclics and metabolites     300 Opiates and metabolites        300 Cocaine and metabolites        300 THC                            50 Performed at [facility], [place] [address], [place], [place] [address]    SARS Coronavirus 2 by RT PCR 10/20/2021 NEGATIVE  NEGATIVE Final   Comment: (NOTE) SARS-CoV-2 target nucleic acids are NOT DETECTED.  The SARS-CoV-2 RNA is generally detectable in upper respiratory specimens during the acute phase of infection. The lowest concentration of SARS-CoV-2 viral copies this assay can detect is 138 copies/mL. A negative result does not preclude SARS-Cov-2 infection and should not be used as the sole basis for treatment or other patient management decisions. A negative result may occur with  improper  specimen collection/handling, submission of specimen other than nasopharyngeal swab, presence of viral mutation(s) within the areas targeted by this assay, and inadequate number of viral copies(<138 copies/mL). A negative result must be combined with clinical observations, patient history,  and epidemiological information. The expected result is Negative.  Fact Sheet for Patients:  BloggerCourse.com  Fact Sheet for Healthcare Providers:  SeriousBroker.it  This test is no                          t yet approved or cleared by the [place] FDA and  has been authorized for detection and/or diagnosis of SARS-CoV-2 by FDA under an Emergency Use Authorization (EUA). This EUA will remain  in effect (meaning this test can be used) for the duration of the COVID-19 declaration under Section 564(b)(1) of the Act, 21 U.S.C.section 360bbb-3(b)(1), unless the authorization is terminated  or revoked sooner.       Influenza A by PCR 10/20/2021 NEGATIVE  NEGATIVE Final   Influenza B by PCR 10/20/2021 NEGATIVE  NEGATIVE Final   Comment: (NOTE) The Xpert Xpress SARS-CoV-2/FLU/RSV plus assay is intended as an aid in the diagnosis of influenza from Nasopharyngeal swab specimens and should not be used as a sole basis for treatment. Nasal washings and aspirates are unacceptable for Xpert Xpress SARS-CoV-2/FLU/RSV testing.  Fact Sheet for Patients: BloggerCourse.com  Fact Sheet for Healthcare Providers: SeriousBroker.it  This test is not yet approved or cleared by the [place] FDA and has been authorized for detection and/or diagnosis of SARS-CoV-2 by FDA under an Emergency Use Authorization (EUA). This EUA will remain in effect (meaning this test can be used) for the duration of the COVID-19 declaration under Section 564(b)(1) of the Act, 21 U.S.C. section 360bbb-3(b)(1), unless the authorization is terminated or revoked.  Performed at [facility], [address], [place], [place] [address]     Allergies: Patient has no known allergies.  PTA Medications: (Not in a hospital admission)  Prior to Admission medications   Medication Sig Start Date End Date Taking?  Authorizing Provider  diphenhydrAMINE (BENADRYL ALLERGY) 25 mg capsule Take 25 mg by mouth every 6 (six) hours as needed for sleep.    [provider]  hydrocortisone cream ([employer] HYDROCORTISONE ANTI-ITCH) 1 % Apply 1 Application topically 2 (two) times daily.    [provider]     Long Term Goals: Improvement in symptoms so as ready for discharge  Short Term Goals: Patient will verbalize feelings in meetings with treatment team members., Patient will attend at least of 50% of the groups daily., Pt will complete the PHQ9 on admission, day 3 and discharge., Patient will participate in completing the [place] Suicide Severity Rating Scale, Patient will score a low risk of violence for 24 hours prior to discharge, and Patient will take medications as prescribed daily.  Medical Decision Making  Recommend admit to the [facility] for substance abuse treatment/Detox and stabilization. Reviewed available Labs from [facility] last 24 hours. UDS positive for Benzos, Cocaine, and THC.  Lab Orders         Hemoglobin A1c         Lipid panel         TSH      Recommend Prn clonidine detox protocol for withdrawal symptoms.  Initiate prn clonidine withdrawal protocol -hydroxyzine 25 mg PO every 6 hours prn for anxiety -ondansetron 4 mg ODT every 6 hours prn nausea/vomiting -naproxen 500 mg BID prn for  pain -dicyclomine 20 mg PO every 6 hours prn abdominal cramping -loperamide 2-4 mg capsule prn diarrhea -methocarbamol 500 mg PO every 8 hours prn spasms   Nicoderm CQ 14 mg Transdermal patch q24 hours Other prn's -Tylenol 650 mg Po prn q6h mild pain -Maalox 30 ml PO q4h prn indigestion -MOM 30 ml daily prn mild constipation -Melatonin 5 mg PO qhs prn sleep  Recommendations  Based on my evaluation the patient does not appear to have an emergency medical condition.   Recommend prn Clonidine withdrawal protocol  Disp-Admit to the [facility]  Lenus Trauger Carolan Shiver, NP 10/21/21  1:57 AM

## 2021-10-20 NOTE — ED Notes (Signed)
Patient belongings place in locker #5

## 2021-10-20 NOTE — ED Notes (Signed)
Called x3 and no response

## 2021-10-20 NOTE — ED Provider Notes (Signed)
[facility] EMERGENCY DEPARTMENT Provider Note   CSN: [id number] Arrival date & time: 10/20/21  1232     History  Chief Complaint  Patient presents with   Suicidal    Vincent Cunningham is a 36 y.o. male.  HPI Patient presents with substance abuse.  Uses some opiates and some benzos but mostly crack cocaine.  Had been cleared for 2 years up until a few months ago.  Now using again.  Seen in the ER yesterday and attempted to get treatment, however states he was not able to get anywhere.  Is more depressed and has suicidal thoughts.  Has thought about overdosing on fentanyl but states he does not think he would do it because he did not want to do that to his 22-year-old son.  Is nervous about a court date coming up but that is over a month from now.  States he does have a diagnosis of depression and potentially bipolar in the past.  Had previously been on some medicines but not currently on any.   Past Medical History:  Diagnosis Date   Anxiety     Home Medications Prior to Admission medications   Medication Sig Start Date End Date Taking? Authorizing Provider  amoxicillin-clavulanate (AUGMENTIN) 875-125 MG tablet Take 1 tablet by mouth every 12 (twelve) hours. 03/21/19   Mardella Layman, MD      Allergies    Patient has no known allergies.    Review of Systems   Review of Systems  Physical Exam Updated Vital Signs BP (!) 130/90 (BP Location: Right Arm)   Pulse 77   Temp 98 F (36.7 C) (Oral)   Resp 18   SpO2 100%  Physical Exam Vitals and nursing note reviewed.  HENT:     Head: Atraumatic.  Eyes:     Extraocular Movements: Extraocular movements intact.  Cardiovascular:     Rate and Rhythm: Regular rhythm.  Pulmonary:     Breath sounds: No wheezing or rhonchi.  Abdominal:     Tenderness: There is no abdominal tenderness.  Musculoskeletal:        General: No tenderness.     Cervical back: Neck supple.  Skin:    General: Skin is warm.     Capillary  Refill: Capillary refill takes less than 2 seconds.  Neurological:     Mental Status: He is alert and oriented to person, place, and time.     ED Results / Procedures / Treatments   Labs (all labs ordered are listed, but only abnormal results are displayed) Labs Reviewed  COMPREHENSIVE METABOLIC PANEL - Abnormal; Notable for the following components:      Result Value   ALT 46 (*)    All other components within normal limits  SALICYLATE LEVEL - Abnormal; Notable for the following components:   Salicylate Lvl <7.0 (*)    All other components within normal limits  ACETAMINOPHEN LEVEL - Abnormal; Notable for the following components:   Acetaminophen (Tylenol), Serum <10 (*)    All other components within normal limits  RAPID URINE DRUG SCREEN, HOSP PERFORMED - Abnormal; Notable for the following components:   Cocaine POSITIVE (*)    Benzodiazepines POSITIVE (*)    Tetrahydrocannabinol POSITIVE (*)    All other components within normal limits  RESP PANEL BY RT-PCR (FLU A&B, COVID) ARPGX2  ETHANOL  CBC    EKG None  Radiology No results found.  Procedures Procedures    Medications Ordered in ED Medications - No data  to display  ED Course/ Medical Decision Making/ A&P                           Medical Decision Making Amount and/or Complexity of Data Reviewed Labs: ordered.   Patient presents with polysubstance abuse.  Depression.  Uses cocaine.  Here for medical clearance.  Also hoping for placement due to his depression.  Has some depressive thoughts and does have a plan but does not think he would do it.  States he has been able to get treatment for his cocaine use.  Has been unable to get in on outpatient treatment.  At this point patient is medically cleared.  Will have patient seen by TTS.        Final Clinical Impression(s) / ED Diagnoses Final diagnoses:  Suicidal thoughts  Polysubstance abuse [facility])    Rx / DC Orders ED Discharge Orders     None          Benjiman Core, MD 10/20/21 1719

## 2021-10-20 NOTE — ED Triage Notes (Signed)
Patient here with complaint of suicidal ideation after attempting to find detox facility for his crack-cocaine addiction. Patient here yesterday for detox and was given resources for facilities but none of facilities he contacted were able to take him for several days. Patient states his last use of crack-cocaine was last night at approximately 2300. Patient expresses ideation to jump from a bridge but states he would never do that to his family ,especially his six-year-old son.

## 2021-10-20 NOTE — ED Notes (Signed)
Belongings, I large bag given to safe transport driver to go with pt to [facility]

## 2021-10-21 ENCOUNTER — Encounter (HOSPITAL_COMMUNITY): Payer: Self-pay

## 2021-10-21 DIAGNOSIS — F1911 Other psychoactive substance abuse, in remission: Secondary | ICD-10-CM | POA: Diagnosis not present

## 2021-10-21 DIAGNOSIS — F1410 Cocaine abuse, uncomplicated: Secondary | ICD-10-CM | POA: Diagnosis not present

## 2021-10-21 DIAGNOSIS — F3131 Bipolar disorder, current episode depressed, mild: Secondary | ICD-10-CM | POA: Diagnosis not present

## 2021-10-21 DIAGNOSIS — F1320 Sedative, hypnotic or anxiolytic dependence, uncomplicated: Secondary | ICD-10-CM | POA: Diagnosis not present

## 2021-10-21 LAB — TSH: TSH: 1.401 u[IU]/mL (ref 0.350–4.500)

## 2021-10-21 LAB — HEMOGLOBIN A1C
Hgb A1c MFr Bld: 5 % (ref 4.8–5.6)
Mean Plasma Glucose: 96.8 mg/dL

## 2021-10-21 LAB — LIPID PANEL
Cholesterol: 135 mg/dL (ref 0–200)
HDL: 49 mg/dL (ref >40)
LDL Cholesterol: 58 mg/dL (ref 0–99)
Total CHOL/HDL Ratio: 2.8 RATIO
Triglycerides: 142 mg/dL (ref <150)
VLDL: 28 mg/dL (ref 0–40)

## 2021-10-21 MED ORDER — NICOTINE POLACRILEX 2 MG MT GUM
2.0000 mg | CHEWING_GUM | OROMUCOSAL | Status: DC | PRN
Start: 2021-10-21 — End: 2021-10-22
  Administered 2021-10-21 – 2021-10-22 (×5): 2 mg via ORAL
  Filled 2021-10-21 (×5): qty 1

## 2021-10-21 MED ORDER — NICOTINE 14 MG/24HR TD PT24
14.0000 mg | MEDICATED_PATCH | Freq: Once | TRANSDERMAL | Status: AC
  Administered 2021-10-21: 14 mg via TRANSDERMAL
  Filled 2021-10-21: qty 1

## 2021-10-21 MED ORDER — QUETIAPINE FUMARATE 50 MG PO TABS
50.0000 mg | ORAL_TABLET | Freq: Every day | ORAL | Status: DC
  Administered 2021-10-21: 50 mg via ORAL
  Filled 2021-10-21: qty 1

## 2021-10-21 MED ORDER — METHOCARBAMOL 500 MG PO TABS: 500.0000 mg | ORAL_TABLET | Freq: Three times a day (TID) | ORAL | Status: DC | PRN

## 2021-10-21 MED ORDER — DICYCLOMINE HCL 20 MG PO TABS: 20.0000 mg | ORAL_TABLET | Freq: Four times a day (QID) | ORAL | Status: DC | PRN

## 2021-10-21 MED ORDER — MELATONIN 5 MG PO TABS
5.0000 mg | ORAL_TABLET | Freq: Every evening | ORAL | Status: DC | PRN
  Administered 2021-10-21 (×2): 5 mg via ORAL
  Filled 2021-10-21 (×2): qty 1

## 2021-10-21 MED ORDER — NICOTINE 14 MG/24HR TD PT24
14.0000 mg | MEDICATED_PATCH | Freq: Every day | TRANSDERMAL | Status: DC
  Administered 2021-10-22: 14 mg via TRANSDERMAL
  Filled 2021-10-21: qty 1

## 2021-10-21 MED ORDER — NAPROXEN 500 MG PO TABS: 500.0000 mg | ORAL_TABLET | Freq: Two times a day (BID) | ORAL | Status: DC | PRN

## 2021-10-21 MED ORDER — NICOTINE 14 MG/24HR TD PT24: 14.0000 mg | MEDICATED_PATCH | Freq: Every day | TRANSDERMAL | Status: DC

## 2021-10-21 MED ORDER — FLUOXETINE HCL 10 MG PO CAPS: 10.0000 mg | ORAL_CAPSULE | Freq: Every day | ORAL | Status: DC

## 2021-10-21 MED ORDER — LOPERAMIDE HCL 2 MG PO CAPS: 2.0000 mg | ORAL_CAPSULE | ORAL | Status: DC | PRN

## 2021-10-21 MED ORDER — ONDANSETRON 4 MG PO TBDP: 4.0000 mg | ORAL_TABLET | Freq: Four times a day (QID) | ORAL | Status: DC | PRN

## 2021-10-21 NOTE — Progress Notes (Signed)
Pt is in the dining room watching TV. No distress noted or concerns voiced. Staff will monitor for pt's safety. 

## 2021-10-21 NOTE — ED Notes (Signed)
Refused AA 

## 2021-10-21 NOTE — ED Notes (Signed)
Patient denies SI, HI, AVH at this time - will continue to monitor for safety

## 2021-10-21 NOTE — BH IP Treatment Plan (Signed)
Interdisciplinary Treatment and Diagnostic Plan Update  10/21/2021 Time of Session: 9:45AM  Vincent Cunningham MRN: [id number]  Diagnosis:  Final diagnoses:  Polysubstance abuse (HCC)  Crack cocaine use  Bipolar affective disorder, currently depressed, mild (HCC)  Xanax use disorder, severe, dependence (HCC)     Current Medications:  Current Facility-Administered Medications  Medication Dose Route Frequency Provider Last Rate Last Admin   acetaminophen (TYLENOL) tablet 650 mg  650 mg Oral Q6H PRN Onuoha, Chinwendu V, NP       alum & mag hydroxide-simeth (MAALOX/MYLANTA) 200-200-20 MG/5ML suspension 30 mL  30 mL Oral Q4H PRN Onuoha, Chinwendu V, NP       dicyclomine (BENTYL) tablet 20 mg  20 mg Oral Q6H PRN Onuoha, Chinwendu V, NP       hydrOXYzine (ATARAX) tablet 25 mg  25 mg Oral TID PRN Onuoha, Chinwendu V, NP       loperamide (IMODIUM) capsule 2-4 mg  2-4 mg Oral PRN Onuoha, Chinwendu V, NP       magnesium hydroxide (MILK OF MAGNESIA) suspension 30 mL  30 mL Oral Daily PRN Onuoha, Chinwendu V, NP       melatonin tablet 5 mg  5 mg Oral QHS PRN Onuoha, Chinwendu V, NP   5 mg at 10/21/21 0113   methocarbamol (ROBAXIN) tablet 500 mg  500 mg Oral Q8H PRN Onuoha, Chinwendu V, NP       naproxen (NAPROSYN) tablet 500 mg  500 mg Oral BID PRN Onuoha, Chinwendu V, NP       nicotine (NICODERM CQ - dosed in mg/24 hours) patch 14 mg  14 mg Transdermal Once Onuoha, Chinwendu V, NP   14 mg at 10/21/21 0047   [START ON 10/22/2021] nicotine (NICODERM CQ - dosed in mg/24 hours) patch 14 mg  14 mg Transdermal Daily [name], MD       ondansetron (ZOFRAN-ODT) disintegrating tablet 4 mg  4 mg Oral Q6H PRN Onuoha, Chinwendu V, NP       Current Outpatient Medications  Medication Sig Dispense Refill   diphenhydrAMINE (BENADRYL ALLERGY) 25 mg capsule Take 25 mg by mouth every 6 (six) hours as needed for sleep.     hydrocortisone cream ([employer] HYDROCORTISONE ANTI-ITCH) 1 % Apply 1 Application topically 2 (two)  times daily.     PTA Medications: Prior to Admission medications   Medication Sig Start Date End Date Taking? Authorizing Provider  diphenhydrAMINE (BENADRYL ALLERGY) 25 mg capsule Take 25 mg by mouth every 6 (six) hours as needed for sleep.    [provider]  hydrocortisone cream ([employer] HYDROCORTISONE ANTI-ITCH) 1 % Apply 1 Application topically 2 (two) times daily.    [provider]    Patient Stressors: Marital or family conflict   Substance abuse    Patient Strengths: Motivation for treatment/growth   Treatment Modalities: Medication Management, Group therapy, Case management,  1 to 1 session with clinician, Psychoeducation, Recreational therapy.   Physician Treatment Plan for Primary and Secondary Diagnosis:  Final diagnoses:  Polysubstance abuse (HCC)  Crack cocaine use  Bipolar affective disorder, currently depressed, mild (HCC)  Xanax use disorder, severe, dependence (HCC)   Long Term Goal(s): Improvement in symptoms so as ready for discharge  Short Term Goals: Patient will verbalize feelings in meetings with treatment team members. Patient will attend at least of 50% of the groups daily. Pt will complete the PHQ9 on admission, day 3 and discharge. Patient will participate in completing the [place] Suicide Severity Rating Scale Patient  will score a low risk of violence for 24 hours prior to discharge Patient will take medications as prescribed daily.  Medication Management: Evaluate patient's response, side effects, and tolerance of medication regimen.  Therapeutic Interventions: 1 to 1 sessions, Unit Group sessions and Medication administration.  Evaluation of Outcomes: Progressing  LCSW Treatment Plan for Primary Diagnosis:  Final diagnoses:  Polysubstance abuse (HCC)  Crack cocaine use  Bipolar affective disorder, currently depressed, mild (HCC)  Xanax use disorder, severe, dependence (HCC)    Long Term Goal(s): Safe transition to  appropriate next level of care at discharge.  Short Term Goals: Facilitate acceptance of mental health diagnosis and concerns through verbal commitment to aftercare plan and appointments at discharge. and Identify minimum of 2 triggers associated with mental health/substance abuse issues with treatment team members.  Therapeutic Interventions: Assess for all discharge needs, 1 to 1 time with Child psychotherapist, Explore available resources and support systems, Assess for adequacy in community support network, Educate family and significant other(s) on suicide prevention, Complete Psychosocial Assessment, Interpersonal group therapy.  Evaluation of Outcomes: Progressing   Progress in Treatment: Attending groups: No. Participating in groups: No. Taking medication as prescribed: Yes. Toleration medication: Yes. Family/Significant other contact made: No, will contact:  no one at this time Patient understands diagnosis: Yes. Discussing patient identified problems/goals with staff: Yes. Medical problems stabilized or resolved: Yes. Denies suicidal/homicidal ideation: Yes. Issues/concerns per patient self-inventory: No. Other: None   New problem(s) identified: No, Describe:  None   New Short Term/Long Term Goal(s): Vincent Cunningham report that his long term goal is to reconcile his differences with his spouse so that he can be "happy".   Patient Goals:  "I want to be clean and happy, so I can be with my family"   Discharge Plan or Barriers:   Reason for Continuation of Hospitalization: Depression Medication stabilization  Estimated Length of Stay: 3-5 days   Last 3 [place] Suicide Severity Risk Score: Flowsheet Row ED from 10/20/2021 in [facility] Most recent reading at 10/21/2021  1:29 AM ED from 10/20/2021 in [facility] EMERGENCY DEPARTMENT Most recent reading at 10/20/2021  2:40 PM ED from 10/19/2021 in [facility] EMERGENCY DEPARTMENT Most  recent reading at 10/19/2021  5:31 PM  C-SSRS RISK CATEGORY High Risk High Risk No Risk       Last PHQ 2/9 Scores:    10/21/2021    1:29 AM 06/05/2014    3:54 PM 02/13/2014   10:17 AM  Depression screen PHQ 2/9  Decreased Interest 2 0 0  Down, Depressed, Hopeless 2 0 0  PHQ - 2 Score 4 0 0  Altered sleeping 1    Tired, decreased energy 1    Change in appetite 1    Feeling bad or failure about yourself  1    Trouble concentrating 1    Moving slowly or fidgety/restless 1    Suicidal thoughts 1    PHQ-9 Score 11    Difficult doing work/chores Somewhat difficult      Scribe for Treatment Team: Maeola Sarah, LCSW 10/21/2021 9:22 AM

## 2021-10-21 NOTE — ED Provider Notes (Cosign Needed Addendum)
FBC Progress Note  Date and Time: 10/21/2021 1:22 PM Name: Bronsen Serano MRN:  [id number]  Reason For Admission: detox and substance use treatment Xanax, Cocaine, THC Subjective:   Patient seen and assessed at bedside.  Patient denies SI/HI/AVH.  Patient's primary concern is being started on medication for his anxiety, detox from substance use, as well as resources for outpatient therapy and medication management.  Patient works Monday through Friday in [employer] and needs to continue doing so so he cannot do residential substance rehab at this time.  Discussed options for patient regarding medication management and patient was amenable to starting Seroquel at night for mood lability.  Patient requesting nicotine patch and nicotine gum for nicotine use disorder.  Patient reports his substance use is primarily for getting a "rush" but does offer insight that he knows that this is making the problem worse overall.  Patient had previously been at [facility] for detox and reported success with that as he had been off substance use for several years.  Diagnosis:  Final diagnoses:  Polysubstance abuse (HCC)  Crack cocaine use  Bipolar affective disorder, currently depressed, mild (HCC)  Xanax use disorder, severe, dependence (HCC)    Total Time spent with patient: 45 minutes   Labs  Lab Results:     Latest Ref Rng & Units 10/20/2021    2:40 PM 02/09/2019    3:21 PM 02/13/2014   11:13 AM  CBC  WBC 4.0 - 10.5 K/uL 6.1  7.5  4.7   Hemoglobin 13.0 - 17.0 g/dL 22.2  97.9  89.2   Hematocrit 39.0 - 52.0 % 42.1  46.6  41.6   Platelets 150 - 400 K/uL 216  180  204       Latest Ref Rng & Units 10/20/2021    2:40 PM 02/09/2019    4:05 PM 02/13/2014   11:13 AM  CMP  Glucose 70 - 99 mg/dL 70  95  96   BUN 6 - 20 mg/dL 12  7  11    Creatinine 0.61 - 1.24 mg/dL  1.19  4.17   Sodium 135 - 145 mmol/L 140  137  143   Potassium 3.5 - 5.1 mmol/L 4.0  4.5  4.8   Chloride 98 - 111 mmol/L 103  103   105   CO2 22 - 32 mmol/L 31  21  29    Calcium 8.9 - 10.3 mg/dL 9.3  8.1  4.08   Total Protein 6.5 - 8.1 g/dL 7.0  6.7  7.6   Total Bilirubin 0.3 - 1.2 mg/dL 0.6  0.9  0.3   Alkaline Phos 38 - 126 U/L 60  53  70   AST 15 - 41 U/L 41  78  34   ALT 0 - 44 U/L 46  109  83     Physical Findings   PHQ2-9    Flowsheet Row ED from 10/20/2021 in [facility] Office Visit from 06/05/2014 in [facility] Office Visit from 02/13/2014 in [facility]  PHQ-2 Total Score 4 0 0  PHQ-9 Total Score 11 -- --      Flowsheet Row ED from 10/20/2021 in [facility] Most recent reading at 10/21/2021  1:29 AM ED from 10/20/2021 in [facility] EMERGENCY DEPARTMENT Most recent reading at 10/20/2021  2:40 PM ED from 10/19/2021 in [facility] EMERGENCY DEPARTMENT Most recent reading at 10/19/2021  5:31 PM  C-SSRS RISK CATEGORY High Risk High Risk No Risk        Musculoskeletal  Strength & Muscle Tone: within normal limits Gait & Station: normal Patient leans: N/A  Psychiatric Specialty Exam  Presentation  General Appearance: Casual   Eye Contact:Good   Speech:Clear and Coherent   Speech Volume:Normal   Handedness:Right    Mood and Affect  Mood:Depressed   Affect:Congruent; Tearful    Thought Process  Thought Processes:Coherent   Descriptions of Associations:Intact   Orientation:Full (Time, Place and Person)   Thought Content:Logical   Diagnosis of Schizophrenia or Schizoaffective disorder in past: No data recorded    Hallucinations:Hallucinations: None   Ideas of Reference:None   Suicidal Thoughts:Suicidal Thoughts: No SI Active Intent and/or Plan: Without Intent; Without Plan   Homicidal Thoughts:Homicidal Thoughts: No    Sensorium  Memory:Immediate Good; Recent  Good   Judgment:Fair   Insight:Fair    Executive Functions  Concentration:Good   Attention Span:Good   Recall:Good   Fund of Knowledge:Good   Language:Good    Psychomotor Activity  Psychomotor Activity:Psychomotor Activity: Normal    Assets  Assets:Communication Skills; Desire for Improvement; Housing; Intimacy; Social Support    Sleep  Sleep:Sleep: Fair    Physical Exam  Physical Exam Vitals and nursing note reviewed.  Constitutional:      General: He is not in acute distress.    Appearance: He is well-developed.  HENT:     Head: Normocephalic and atraumatic.  Eyes:     Conjunctiva/sclera: Conjunctivae normal.  Cardiovascular:     Rate and Rhythm: Normal rate and regular rhythm.     Heart sounds: No murmur heard. Pulmonary:     Effort: Pulmonary effort is normal. No respiratory distress.     Breath sounds: Normal breath sounds.  Abdominal:     Palpations: Abdomen is soft.     Tenderness: There is no abdominal tenderness.  Musculoskeletal:        General: No swelling.     Cervical back: Neck supple.  Skin:    General: Skin is warm and dry.     Capillary Refill: Capillary refill takes less than 2 seconds.  Neurological:     Mental Status: He is alert.  Psychiatric:        Mood and Affect: Mood normal.   Review of Systems  Respiratory:  Negative for shortness of breath.   Cardiovascular:  Negative for chest pain.  Gastrointestinal:  Negative for abdominal pain, constipation, diarrhea, heartburn, nausea and vomiting.  Neurological:  Negative for headaches.   Blood pressure 99/61, pulse (!) 55, temperature 97.8 F (36.6 C), temperature source Oral, resp. rate 15, SpO2 99 %. There is no height or weight on file to calculate BMI.  ASSESSMENT Hailey Miles is a 36 y.o. male with PMHx of sedative use disorder, cocaine use disorder, generalized anxiety disorder, presenting to [facility] on 10/20/2021 for benzodiazepene detox and outpatient substance  rehab resources  PLAN Substance induced Mood Disorder History of Bipolar Disorder. Does report history of hypomania in his 78s outside of substance use -Seroquel 50 mg qhs today with plan to increase to 100 mg tomorrow  Sedative Use Disorder (Xanax) Cocaine Use Disorder Cannabis Use Disorder -CIWA protocol to monitor benzo withdrawal -Encourage cessation -LCSW to assist with outpatient resources  Nicotine Use Disorder -NRT with patch and gum   Dispo: Discharge Friday   [name], MD 10/21/2021 1:22 PM

## 2021-10-21 NOTE — ED Notes (Signed)
Pt is in the bed sleeping. Respirations are even and unlabored. No acute distress noted. Will continue to monitor or safety. 

## 2021-10-21 NOTE — ED Notes (Addendum)
Pt admitted to [facility] due to substance abuse and seeking treatment. Pt denies SI,HI and AVH at present. Patient was cooperative during the admission assessment. Skin assessment complete. Belongings in the [place]. Patient oriented to unit and unit rules. Meal and drinks offered to patient.  Patient verbalized agreement to treatment plans. Patient verbally contracts for safety while hospitalized. Will monitor for safety.

## 2021-10-21 NOTE — ED Notes (Signed)
Patient sleeping with no sxs of distress noted - will continue to monitor for safety 

## 2021-10-21 NOTE — Progress Notes (Signed)
Pt is awake, alert and oriented X4. Pt did not voice any complaints of pain or discomfort. No distress noted. Pt denies current SI/HI/AVH. Staff will monitor for pt's safety.

## 2021-10-21 NOTE — Progress Notes (Signed)
Pt's CIWA was 1. 

## 2021-10-22 DIAGNOSIS — F3131 Bipolar disorder, current episode depressed, mild: Secondary | ICD-10-CM | POA: Diagnosis not present

## 2021-10-22 DIAGNOSIS — F1410 Cocaine abuse, uncomplicated: Secondary | ICD-10-CM | POA: Diagnosis not present

## 2021-10-22 DIAGNOSIS — F1320 Sedative, hypnotic or anxiolytic dependence, uncomplicated: Secondary | ICD-10-CM | POA: Diagnosis not present

## 2021-10-22 DIAGNOSIS — F1911 Other psychoactive substance abuse, in remission: Secondary | ICD-10-CM | POA: Diagnosis not present

## 2021-10-22 MED ORDER — MELATONIN 5 MG PO TABS: 5.0000 mg | ORAL_TABLET | Freq: Every evening | ORAL | 0 refills | Status: AC | PRN

## 2021-10-22 MED ORDER — NICOTINE 14 MG/24HR TD PT24: 14.0000 mg | MEDICATED_PATCH | Freq: Every day | TRANSDERMAL | 0 refills | Status: AC

## 2021-10-22 MED ORDER — NICOTINE POLACRILEX 2 MG MT GUM: 2.0000 mg | CHEWING_GUM | OROMUCOSAL | 0 refills | Status: AC | PRN

## 2021-10-22 MED ORDER — HYDROXYZINE HCL 25 MG PO TABS: 25.0000 mg | ORAL_TABLET | Freq: Three times a day (TID) | ORAL | 0 refills | Status: AC | PRN

## 2021-10-22 MED ORDER — QUETIAPINE FUMARATE 100 MG PO TABS
100.0000 mg | ORAL_TABLET | Freq: Every day | ORAL | Status: DC
Start: 2021-10-22 — End: 2021-10-22

## 2021-10-22 MED ORDER — TRAZODONE HCL 50 MG PO TABS: 50.0000 mg | ORAL_TABLET | Freq: Every evening | ORAL | 0 refills | Status: AC | PRN

## 2021-10-22 MED ORDER — QUETIAPINE FUMARATE 100 MG PO TABS
100.0000 mg | ORAL_TABLET | Freq: Every day | ORAL | 0 refills | Status: AC
Start: 2021-10-22 — End: 2021-11-21

## 2021-10-22 NOTE — ED Notes (Signed)
Snacks given 

## 2021-10-22 NOTE — ED Provider Notes (Addendum)
FBC Progress Note  Date and Time: 10/22/2021 10:27 AM Name: Vincent Cunningham MRN:  [id number]  Reason For Admission: detox and substance use treatment Xanax, Cocaine, THC Subjective:   Patient seen and assessed at bedside.  Patient denies SI/HI/AVH.  Denies side effect from seroquel. Denies withdrawal symptoms. No questions at this time. Plan for discharge tomorrow.  Diagnosis:  Final diagnoses:  Polysubstance abuse (HCC)  Crack cocaine use  Bipolar affective disorder, currently depressed, mild (HCC)  Xanax use disorder, severe, dependence (HCC)    Total Time spent with patient: 45 minutes   Labs  Lab Results:     Latest Ref Rng & Units 10/20/2021    2:40 PM 02/09/2019    3:21 PM 02/13/2014   11:13 AM  CBC  WBC 4.0 - 10.5 K/uL 6.1  7.5  4.7   Hemoglobin 13.0 - 17.0 g/dL 25.8  52.7  78.2   Hematocrit 39.0 - 52.0 % 42.1  46.6  41.6   Platelets 150 - 400 K/uL 216  180  204       Latest Ref Rng & Units 10/20/2021    2:40 PM 02/09/2019    4:05 PM 02/13/2014   11:13 AM  CMP  Glucose 70 - 99 mg/dL 70  95  96   BUN 6 - 20 mg/dL 12  7  11    Creatinine 0.61 - 1.24 mg/dL  4.23  5.36   Sodium 135 - 145 mmol/L 140  137  143   Potassium 3.5 - 5.1 mmol/L 4.0  4.5  4.8   Chloride 98 - 111 mmol/L 103  103  105   CO2 22 - 32 mmol/L 31  21  29    Calcium 8.9 - 10.3 mg/dL 9.3  8.1  1.44   Total Protein 6.5 - 8.1 g/dL 7.0  6.7  7.6   Total Bilirubin 0.3 - 1.2 mg/dL 0.6  0.9  0.3   Alkaline Phos 38 - 126 U/L 60  53  70   AST 15 - 41 U/L 41  78  34   ALT 0 - 44 U/L 46  109  83     Physical Findings   PHQ2-9    Flowsheet Row ED from 10/20/2021 in [facility] Office Visit from 06/05/2014 in [facility] Office Visit from 02/13/2014 in [facility]  PHQ-2 Total Score 4 0 0  PHQ-9 Total Score 11 -- --      Flowsheet Row ED from 10/20/2021 in [facility] Most recent  reading at 10/21/2021  1:29 AM ED from 10/20/2021 in [facility] EMERGENCY DEPARTMENT Most recent reading at 10/20/2021  2:40 PM ED from 10/19/2021 in [facility] EMERGENCY DEPARTMENT Most recent reading at 10/19/2021  5:31 PM  C-SSRS RISK CATEGORY High Risk High Risk No Risk        Musculoskeletal  Strength & Muscle Tone: within normal limits Gait & Station: normal Patient leans: N/A  Psychiatric Specialty Exam  Presentation  General Appearance: Appropriate for Environment   Eye Contact:Good   Speech:Clear and Coherent; Normal Rate   Speech Volume:Normal   Handedness:Right    Mood and Affect  Mood:Euthymic   Affect:Appropriate; Congruent    Thought Process  Thought Processes:Coherent; Goal Directed; Linear   Descriptions of Associations:Intact   Orientation:Full (Time, Place and Person)   Thought Content:Logical   Diagnosis of Schizophrenia or Schizoaffective disorder in past: No  data recorded    Hallucinations:Hallucinations: None    Ideas of Reference:None   Suicidal Thoughts:Suicidal Thoughts: No    Homicidal Thoughts:Homicidal Thoughts: No     Sensorium  Memory:Immediate Good; Recent Good; Remote Good   Judgment:Fair   Insight:Fair    Executive Functions  Concentration:Good   Attention Span:Good   Recall:Good   Fund of Knowledge:Good   Language:Good    Psychomotor Activity  Psychomotor Activity:Psychomotor Activity: Normal     Assets  Assets:Communication Skills; Desire for Improvement; Housing; Intimacy; Social Support    Sleep  Sleep:Sleep: Fair     Physical Exam  Physical Exam Vitals and nursing note reviewed.  Constitutional:      General: He is not in acute distress.    Appearance: He is well-developed.  HENT:     Head: Normocephalic and atraumatic.  Eyes:     Conjunctiva/sclera: Conjunctivae normal.  Cardiovascular:     Rate and Rhythm: Normal rate and  regular rhythm.     Heart sounds: No murmur heard. Pulmonary:     Effort: Pulmonary effort is normal. No respiratory distress.     Breath sounds: Normal breath sounds.  Abdominal:     Palpations: Abdomen is soft.     Tenderness: There is no abdominal tenderness.  Musculoskeletal:        General: No swelling.     Cervical back: Neck supple.  Skin:    General: Skin is warm and dry.     Capillary Refill: Capillary refill takes less than 2 seconds.  Neurological:     Mental Status: He is alert.  Psychiatric:        Mood and Affect: Mood normal.    Review of Systems  Respiratory:  Negative for shortness of breath.   Cardiovascular:  Negative for chest pain.  Gastrointestinal:  Negative for abdominal pain, constipation, diarrhea, heartburn, nausea and vomiting.  Neurological:  Negative for headaches.   Blood pressure 99/67, pulse 64, temperature 98.3 F (36.8 C), temperature source Oral, resp. rate 17, SpO2 100 %. There is no height or weight on file to calculate BMI.  ASSESSMENT Vincent Cunningham is a 36 y.o. male with PMHx of sedative use disorder, cocaine use disorder, generalized anxiety disorder, presenting to [facility] on 10/20/2021 for benzodiazepene detox and outpatient substance rehab resources  PLAN Substance induced Mood Disorder History of Bipolar Disorder. Does report history of hypomania in his 50s outside of substance use -Increase Seroquel to 100 mg qhs  Sedative Use Disorder (Xanax) Cocaine Use Disorder Cannabis Use Disorder -CIWA protocol to monitor benzo withdrawal (1,0) -Encourage cessation -LCSW to assist with outpatient resources  Nicotine Use Disorder -NRT with patch and gum   Dispo: Discharge Friday with OP resources   [name], MD 10/22/2021 10:27 AM

## 2021-10-22 NOTE — ED Notes (Signed)
Pt is awake and alert.  He has been visible in milieu and eating meals with peers.  Pt has flat affect that brightens upon engagement.  Pt reports " some anxiety"  and has been given prn medication for this.  He  Denies SI, HI or AVH at this time.

## 2021-10-22 NOTE — ED Notes (Signed)
Watching movie with peer 

## 2021-10-22 NOTE — Discharge Instructions (Addendum)

## 2021-10-22 NOTE — ED Provider Notes (Signed)
FBC/OBS ASAP Discharge Summary  Date and Time: 10/22/2021 4:13 PM  Name: Vincent Cunningham  MRN:  [id number]   Discharge Diagnoses:  Final diagnoses:  Polysubstance abuse (HCC)  Crack cocaine use  Bipolar affective disorder, currently depressed, mild (HCC)  Xanax use disorder, severe, dependence (HCC)    Subjective: Patient seen and assessed in day room.  Patient denies SI/HI/AVH.  Patient reports that he is ready to discharge.  Apparently, patient just spoke with father and reports that he will be unable to go home at this time so he will require cab to hotel as well as will be starting work tomorrow morning.  Patient feels he is able to contract for safety and is eager to discharge.  Patient reports that he will cease all substance use and feels he will be able to do this with medication as opposed to substance.  Stay Summary by Problem List Jawanza Zambito is a 36 y.o. male with PMHx of sedative use disorder, cocaine use disorder, generalized anxiety disorder, presenting to [facility] on 10/20/2021 for benzodiazepene detox and outpatient substance rehab resources. Hospital course is detailed below:  Substance induced Mood Disorder Hx of type 2 bipolar disorder Patient reports history of bipolar disorder since his early 56s.  Patient reports that he has had hypomanic episodes outside of substance use.  Patient was started on Seroquel 50 mg nightly which was titrated up to 100 mg nightly.  Patient denies significant side effects from this medication during hospitalization.  Sedative Use Disorder (Xanax) Cocaine Use Disorder Cannabis Use Disorder Nicotine use disorder Patient was monitored with CIWA protocol as well as for any substance use withdrawal symptoms.  Patient's CIWA score throughout hospitalization was less than 5 and was 0 at time of discharge.  Patient reported that he would cease all substance use as he is motivated to see his son and be able to support his son.  Patient was started on  nicotine gum and patch in order to manage nicotine use disorder.   Total Time spent with patient: 45 minutes  Past Psychiatric History: cocaine use disorder, cannabis use disorder, sedative use disorder, type 2 bipolar disorder, substance induced mood disorder Past Medical History:  Past Medical History:  Diagnosis Date   Anxiety    No past surgical history on file. Family History:  Family History  Problem Relation Age of Onset   Bipolar disorder Other    Family Psychiatric History: unknown Social History:  Social History   Substance and Sexual Activity  Alcohol Use No   Alcohol/week: 0.0 standard drinks of alcohol   Comment: pt states no alchohol since 01/13/14     Social History   Substance and Sexual Activity  Drug Use No   Types: Marijuana   Comment: pt states no drugs since 01/13/14    Social History   Socioeconomic History   Marital status: Single    Spouse name: Not on file   Number of children: Not on file   Years of education: Not on file   Highest education level: Not on file  Occupational History   Not on file  Tobacco Use   Smoking status: Every Day    Packs/day: 0.50    Types: Cigarettes    Start date: 03/15/1998   Smokeless tobacco: Never  Substance and Sexual Activity   Alcohol use: No    Alcohol/week: 0.0 standard drinks of alcohol    Comment: pt states no alchohol since 01/13/14   Drug use: No    Types:  Marijuana    Comment: pt states no drugs since 01/13/14   Sexual activity: Not Currently    Partners: Female  Other Topics Concern   Not on file  Social History Narrative   Not on file   Social Determinants of Health   Financial Resource Strain: Not on file  Food Insecurity: Not on file  Transportation Needs: Not on file  Physical Activity: Not on file  Stress: Not on file  Social Connections: Not on file   SDOH:  SDOH Screenings   Alcohol Screen: Not on file  Depression (PHQ2-9): High Risk (10/21/2021)   Depression (PHQ2-9)     PHQ-2 Score: 11  Financial Resource Strain: Not on file  Food Insecurity: Not on file  Housing: Not on file  Physical Activity: Not on file  Social Connections: Not on file  Stress: Not on file  Tobacco Use: High Risk (10/19/2021)   Patient History    Smoking Tobacco Use: Every Day    Smokeless Tobacco Use: Never    Passive Exposure: Not on file  Transportation Needs: Not on file    Tobacco Cessation:  A prescription for an FDA-approved tobacco cessation medication provided at discharge  Current Medications:  Current Facility-Administered Medications  Medication Dose Route Frequency Provider Last Rate Last Admin   acetaminophen (TYLENOL) tablet 650 mg  650 mg Oral Q6H PRN Onuoha, Chinwendu V, NP       alum & mag hydroxide-simeth (MAALOX/MYLANTA) 200-200-20 MG/5ML suspension 30 mL  30 mL Oral Q4H PRN Onuoha, Chinwendu V, NP       dicyclomine (BENTYL) tablet 20 mg  20 mg Oral Q6H PRN Onuoha, Chinwendu V, NP       hydrOXYzine (ATARAX) tablet 25 mg  25 mg Oral TID PRN Onuoha, Chinwendu V, NP   25 mg at 10/22/21 1223   loperamide (IMODIUM) capsule 2-4 mg  2-4 mg Oral PRN Onuoha, Chinwendu V, NP       magnesium hydroxide (MILK OF MAGNESIA) suspension 30 mL  30 mL Oral Daily PRN Onuoha, Chinwendu V, NP       melatonin tablet 5 mg  5 mg Oral QHS PRN Onuoha, Chinwendu V, NP   5 mg at 10/21/21 2125   methocarbamol (ROBAXIN) tablet 500 mg  500 mg Oral Q8H PRN Onuoha, Chinwendu V, NP       nicotine (NICODERM CQ - dosed in mg/24 hours) patch 14 mg  14 mg Transdermal Daily [name], MD   14 mg at 10/22/21 8099   nicotine polacrilex (NICORETTE) gum 2 mg  2 mg Oral PRN [name], MD   2 mg at 10/22/21 1600   ondansetron (ZOFRAN-ODT) disintegrating tablet 4 mg  4 mg Oral Q6H PRN Onuoha, Chinwendu V, NP       QUEtiapine (SEROQUEL) tablet 100 mg  100 mg Oral QHS [name], MD       Current Outpatient Medications  Medication Sig Dispense Refill   traZODone (DESYREL) 50 MG tablet Take 1 tablet (50 mg  total) by mouth at bedtime as needed for sleep. 30 tablet 0   hydrOXYzine (ATARAX) 25 MG tablet Take 1 tablet (25 mg total) by mouth 3 (three) times daily as needed for anxiety. 90 tablet 0   melatonin 5 MG TABS Take 1 tablet (5 mg total) by mouth at bedtime as needed.  0   [START ON 10/23/2021] nicotine (NICODERM CQ - DOSED IN MG/24 HOURS) 14 mg/24hr patch Place 1 patch (14 mg total) onto the skin daily.  28 patch 0   nicotine polacrilex (NICORETTE) 2 MG gum Take 1 each (2 mg total) by mouth as needed for smoking cessation. 100 tablet 0   QUEtiapine (SEROQUEL) 100 MG tablet Take 1 tablet (100 mg total) by mouth at bedtime. 30 tablet 0    PTA Medications: (Not in a hospital admission)       10/21/2021    1:29 AM 10/20/2021   10:53 AM 06/05/2014    3:54 PM  Depression screen PHQ 2/9  Decreased Interest 2 1 0  Down, Depressed, Hopeless 2 3 0  PHQ - 2 Score 4 4 0  Altered sleeping 1 3   Tired, decreased energy 1 3   Change in appetite 1 1   Feeling bad or failure about yourself  1 3   Trouble concentrating 1 2   Moving slowly or fidgety/restless 1 1   Suicidal thoughts 1 3   PHQ-9 Score 11 20   Difficult doing work/chores Somewhat difficult Extremely dIfficult     Flowsheet Row ED from 10/20/2021 in [facility] Most recent reading at 10/21/2021  1:29 AM ED from 10/20/2021 in [facility] EMERGENCY DEPARTMENT Most recent reading at 10/20/2021  2:40 PM ED from 10/19/2021 in [facility] EMERGENCY DEPARTMENT Most recent reading at 10/19/2021  5:31 PM  C-SSRS RISK CATEGORY High Risk High Risk No Risk       Musculoskeletal  Strength & Muscle Tone: within normal limits Gait & Station: normal Patient leans: N/A  Psychiatric Specialty Exam  Presentation  General Appearance: Appropriate for Environment   Eye Contact:Good   Speech:Clear and Coherent; Normal Rate   Speech Volume:Normal   Handedness:Right    Mood and Affect   Mood:Euthymic   Affect:Appropriate; Congruent    Thought Process  Thought Processes:Coherent; Goal Directed; Linear   Descriptions of Associations:Intact   Orientation:Full (Time, Place and Person)   Thought Content:Logical      Hallucinations:Hallucinations: None   Ideas of Reference:None   Suicidal Thoughts:Suicidal Thoughts: No   Homicidal Thoughts:Homicidal Thoughts: No    Sensorium  Memory:Immediate Good; Recent Good; Remote Good   Judgment:Fair   Insight:Fair    Executive Functions  Concentration:Good   Attention Span:Good   Recall:Good   Fund of Knowledge:Good   Language:Good    Psychomotor Activity  Psychomotor Activity:Psychomotor Activity: Normal    Assets  Assets:Communication Skills; Desire for Improvement; Housing; Intimacy; Social Support    Sleep  Sleep:Sleep: Fair    No data recorded   Physical Exam  Physical Exam Vitals and nursing note reviewed.  Constitutional:      General: He is not in acute distress.    Appearance: He is well-developed.  HENT:     Head: Normocephalic and atraumatic.  Eyes:     Conjunctiva/sclera: Conjunctivae normal.  Cardiovascular:     Rate and Rhythm: Normal rate and regular rhythm.     Heart sounds: No murmur heard. Pulmonary:     Effort: Pulmonary effort is normal. No respiratory distress.     Breath sounds: Normal breath sounds.  Abdominal:     Palpations: Abdomen is soft.     Tenderness: There is no abdominal tenderness.  Musculoskeletal:        General: No swelling.     Cervical back: Neck supple.  Skin:    General: Skin is warm and dry.     Capillary Refill: Capillary refill takes less than 2 seconds.  Neurological:     Mental  Status: He is alert.  Psychiatric:        Mood and Affect: Mood normal.    Review of Systems  Respiratory:  Negative for shortness of breath.   Cardiovascular:  Negative for chest pain.  Gastrointestinal:  Negative for abdominal  pain, constipation, diarrhea, heartburn, nausea and vomiting.  Neurological:  Negative for headaches.   Blood pressure 99/67, pulse 64, temperature 98.3 F (36.8 C), temperature source Oral, resp. rate 17, SpO2 100 %. There is no height or weight on file to calculate BMI.  Demographic Factors:  Male  Loss Factors: Financial problems/change in socioeconomic status  Historical Factors: Impulsivity  Risk Reduction Factors:   Employed, Living with another person, especially a relative, and Positive coping skills or problem solving skills  Continued Clinical Symptoms:  Alcohol/Substance Abuse/Dependencies More than one psychiatric diagnosis  Cognitive Features That Contribute To Risk:  None    Suicide Risk:  Mild:  Suicidal ideation of limited frequency, intensity, duration, and specificity.  There are no identifiable plans, no associated intent, mild dysphoria and related symptoms, good self-control (both objective and subjective assessment), few other risk factors, and identifiable protective factors, including available and accessible social support.  Plan Of Care/Follow-up recommendations:   Follow-up recommendations:   Activity:  as tolerated Diet:  heart healthy   Comments:  Prescriptions were given at discharge.  Patient is agreeable with the discharge plan.  Patient was given an opportunity to ask questions.  Patient appears to feel comfortable with discharge and denies any current suicidal or homicidal thoughts.    Patient is instructed prior to discharge to: Take all medications as prescribed by mental healthcare provider. Report any adverse effects and or reactions from the medicines to outpatient provider promptly. In the event of worsening symptoms, patient is instructed to call the crisis hotline, 911 and or go to the nearest ED for appropriate evaluation and treatment of symptoms. Patient is to follow-up with primary care provider for other medical issues, concerns  and or health care needs.   [name], MD 10/22/2021, 4:13 PM

## 2021-10-22 NOTE — ED Notes (Signed)
Patient continues to sleep with no sxs of distress noted - will continue to monitor for safety 

## 2021-10-22 NOTE — ED Notes (Signed)
Pt became upset while talking on the phone with his dad. He is asking to speak with SW.

## 2021-10-28 ENCOUNTER — Telehealth (HOSPITAL_COMMUNITY): Payer: Self-pay | Admitting: General Practice

## 2021-10-28 NOTE — BH Assessment (Signed)
Care Management - [facility] Follow Up Discharges   Writer attempted to make contact with patient today and was unsuccessful.  Phone just rang.   Per chart review, patient was provided with outpatient substance abuse resources.

## 2022-07-12 ENCOUNTER — Emergency Department (HOSPITAL_COMMUNITY)
Admission: EM | Admit: 2022-07-12 | Discharge: 2022-07-12 | Disposition: A | Discharge status: Home / Self Care | Payer: No Typology Code available for payment source | Attending: Student | Admitting: Student

## 2022-07-12 ENCOUNTER — Other Ambulatory Visit: Payer: Self-pay

## 2022-07-12 ENCOUNTER — Encounter (HOSPITAL_COMMUNITY): Payer: Self-pay

## 2022-07-12 DIAGNOSIS — R7401 Elevation of levels of liver transaminase levels: Secondary | ICD-10-CM | POA: Insufficient documentation

## 2022-07-12 DIAGNOSIS — F17210 Nicotine dependence, cigarettes, uncomplicated: Secondary | ICD-10-CM | POA: Insufficient documentation

## 2022-07-12 DIAGNOSIS — R21 Rash and other nonspecific skin eruption: Secondary | ICD-10-CM | POA: Insufficient documentation

## 2022-07-12 DIAGNOSIS — A6002 Herpesviral infection of other male genital organs: Secondary | ICD-10-CM | POA: Insufficient documentation

## 2022-07-12 DIAGNOSIS — B1920 Unspecified viral hepatitis C without hepatic coma: Secondary | ICD-10-CM | POA: Insufficient documentation

## 2022-07-12 LAB — COMPREHENSIVE METABOLIC PANEL
ALT: 259 U/L — ABNORMAL HIGH (ref 0–44)
AST: 98 U/L — ABNORMAL HIGH (ref 15–41)
Albumin: 4.3 g/dL (ref 3.5–5.0)
Alkaline Phosphatase: 67 U/L (ref 38–126)
Anion gap: 12 (ref 5–15)
BUN: 9 mg/dL (ref 6–20)
CO2: 27 mmol/L (ref 22–32)
Calcium: 9.5 mg/dL (ref 8.9–10.3)
Chloride: 98 mmol/L (ref 98–111)
Creatinine, Ser: 0.99 mg/dL (ref 0.61–1.24)
GFR, Estimated: >60 mL/min (ref >60)
Glucose, Bld: 102 mg/dL — ABNORMAL HIGH (ref 70–99)
Potassium: 4.3 mmol/L (ref 3.5–5.1)
Sodium: 137 mmol/L (ref 135–145)
Total Bilirubin: 0.5 mg/dL (ref 0.3–1.2)
Total Protein: 8 g/dL (ref 6.5–8.1)

## 2022-07-12 LAB — CBC WITH DIFFERENTIAL/PLATELET
Abs Immature Granulocytes: 0.01 10*3/uL (ref 0.00–0.07)
Basophils Absolute: 0 10*3/uL (ref 0.0–0.1)
Basophils Relative: 1 %
Eosinophils Absolute: 0.1 10*3/uL (ref 0.0–0.5)
Eosinophils Relative: 1 %
HCT: 46.2 % (ref 39.0–52.0)
Hemoglobin: 16.2 g/dL (ref 13.0–17.0)
Immature Granulocytes: 0 %
Lymphocytes Relative: 25 %
Lymphs Abs: 1.9 10*3/uL (ref 0.7–4.0)
MCH: 32.1 pg (ref 26.0–34.0)
MCHC: 35.1 g/dL (ref 30.0–36.0)
MCV: 91.7 fL (ref 80.0–100.0)
Monocytes Absolute: 0.7 10*3/uL (ref 0.1–1.0)
Monocytes Relative: 9 %
Neutro Abs: 4.8 10*3/uL (ref 1.7–7.7)
Neutrophils Relative %: 64 %
Platelets: 169 10*3/uL (ref 150–400)
RBC: 5.04 MIL/uL (ref 4.22–5.81)
RDW: 12.3 % (ref 11.5–15.5)
WBC: 7.4 10*3/uL (ref 4.0–10.5)
nRBC: 0 % (ref 0.0–0.2)

## 2022-07-12 MED ORDER — KETOROLAC TROMETHAMINE 15 MG/ML IJ SOLN
15.0000 mg | Freq: Once | INTRAMUSCULAR | Status: AC
  Administered 2022-07-12: 15 mg via INTRAMUSCULAR
  Filled 2022-07-12: qty 1

## 2022-07-12 MED ORDER — VALACYCLOVIR HCL 1 G PO TABS: 1000.0000 mg | ORAL_TABLET | Freq: Every day | ORAL | 0 refills | Status: AC

## 2022-07-12 MED ORDER — VALACYCLOVIR HCL 500 MG PO TABS
1000.0000 mg | ORAL_TABLET | Freq: Once | ORAL | Status: AC
Start: 2022-07-12 — End: 2022-07-12
  Administered 2022-07-12: 1000 mg via ORAL
  Filled 2022-07-12: qty 2

## 2022-07-12 MED ORDER — PSEUDOEPHEDRINE HCL ER 120 MG PO TB12
120.0000 mg | ORAL_TABLET | Freq: Once | ORAL | Status: AC
  Administered 2022-07-12: 120 mg via ORAL
  Filled 2022-07-12: qty 1

## 2022-07-12 NOTE — ED Triage Notes (Signed)
Pt arrived POV from home c/o multiple complaints. Pt states he has had rib pain for over a year, has not had a solid bowel movement in 18 months. Pt states he initially came for flu like symptoms and a rash but he said he wants to get everything checked out since he avoids coming.

## 2022-07-12 NOTE — ED Provider Notes (Signed)
[place] EMERGENCY DEPARTMENT AT [facility] Provider Note  CSN: [id number] Arrival date & time: 07/12/22 1023  Chief Complaint(s) Rash  HPI Vincent Cunningham is a 37 y.o. male with PMH polysubstance abuse, chronic hepatitis C, anxiety who presents emergency room for evaluation of multiple complaints including right upper quadrant pain, penile rash and upper respiratory symptoms.  Patient states that he was previously diagnosed with hepatitis C but has not followed up for eradication.  He states that he has intermittently used crack cocaine since being released from incarceration but has not used IV drugs since.  He endorses 1 weeks of upper respiratory symptoms and a recent penile rash.  He denies dysuria, increased frequency, penile discharge, nausea, vomiting or other systemic symptoms.  Endorses pain along the rib line on the right upper quadrant.  Endorses persistent diarrhea.   Past Medical History Past Medical History:  Diagnosis Date   Anxiety    Patient Active Problem List   Diagnosis Date Noted   Cocaine abuse (HCC) 10/20/2021   Suicidal ideation 10/20/2021   Polysubstance abuse (HCC) 10/20/2021   Chronic hepatitis C without hepatic coma (HCC) 06/05/2014   Smoking 02/13/2014   History of drug use 02/13/2014   Home Medication(s) Prior to Admission medications   Medication Sig Start Date End Date Taking? Authorizing Provider  valACYclovir (VALTREX) 1000 MG tablet Take 1 tablet (1,000 mg total) by mouth daily. 07/12/22  Yes Natanel Snavely, MD  hydrOXYzine (ATARAX) 25 MG tablet Take 1 tablet (25 mg total) by mouth 3 (three) times daily as needed for anxiety. 10/22/21   [name], MD  melatonin 5 MG TABS Take 1 tablet (5 mg total) by mouth at bedtime as needed. 10/22/21   [name], MD  nicotine (NICODERM CQ - DOSED IN MG/24 HOURS) 14 mg/24hr patch Place 1 patch (14 mg total) onto the skin daily. 10/23/21   [name], MD  nicotine polacrilex (NICORETTE) 2 MG gum Take 1  each (2 mg total) by mouth as needed for smoking cessation. 10/22/21   [name], MD  QUEtiapine (SEROQUEL) 100 MG tablet Take 1 tablet (100 mg total) by mouth at bedtime. 10/22/21 11/21/21  [name], MD  traZODone (DESYREL) 50 MG tablet Take 1 tablet (50 mg total) by mouth at bedtime as needed for sleep. 10/22/21   [name], MD                                                                                                                                    Past Surgical History History reviewed. No pertinent surgical history. Family History Family History  Problem Relation Age of Onset   Bipolar disorder Other     Social History Social History   Tobacco Use   Smoking status: Every Day    Packs/day: .5    Types: Cigarettes    Start date: 03/15/1998   Smokeless tobacco: Never  Substance Use Topics  Alcohol use: No    Alcohol/week: 0.0 standard drinks of alcohol    Comment: pt states no alchohol since 01/13/14   Drug use: No    Types: Marijuana    Comment: pt states no drugs since 01/13/14   Allergies Patient has no known allergies.  Review of Systems Review of Systems  HENT:  Positive for rhinorrhea.   Respiratory:  Positive for cough.   Gastrointestinal:  Positive for abdominal pain and diarrhea.  Skin:  Positive for rash.    Physical Exam Vital Signs  I have reviewed the triage vital signs BP 127/83   Pulse 97   Temp 98.6 F (37 C) (Oral)   Resp 19   Ht 5\' 8"  (1.727 m)   Wt 63.5 kg   SpO2 100%   BMI 21.29 kg/m   Physical Exam Vitals and nursing note reviewed.  Constitutional:      General: He is not in acute distress.    Appearance: Normal appearance. He is well-developed.  HENT:     Head: Normocephalic and atraumatic.     Nose: No congestion or rhinorrhea.  Eyes:     General:        Right eye: No discharge.        Left eye: No discharge.     Extraocular Movements: Extraocular movements intact.     Conjunctiva/sclera: Conjunctivae normal.     Pupils:  Pupils are equal, round, and reactive to light.  Cardiovascular:     Rate and Rhythm: Normal rate and regular rhythm.     Heart sounds: No murmur heard. Pulmonary:     Effort: Pulmonary effort is normal. No respiratory distress.     Breath sounds: No wheezing or rales.  Abdominal:     General: There is no distension.     Tenderness: There is no abdominal tenderness.  Genitourinary:    Comments: Vesicular rash Musculoskeletal:        General: Tenderness present. No swelling. Normal range of motion.     Cervical back: Normal range of motion and neck supple.  Skin:    General: Skin is warm and dry.  Neurological:     General: No focal deficit present.     Mental Status: He is alert.  Psychiatric:        Mood and Affect: Mood normal.     ED Results and Treatments Labs (all labs ordered are listed, but only abnormal results are displayed) Labs Reviewed  COMPREHENSIVE METABOLIC PANEL - Abnormal; Notable for the following components:      Result Value   Glucose, Bld 102 (*)    AST 98 (*)    ALT 259 (*)    All other components within normal limits  CBC WITH DIFFERENTIAL/PLATELET                                                                                                                          Radiology No results found.  Pertinent labs &  imaging results that were available during my care of the patient were reviewed by me and considered in my medical decision making (see MDM for details).  Medications Ordered in ED Medications  ketorolac (TORADOL) 15 MG/ML injection 15 mg (15 mg Intramuscular Given 07/12/22 1435)  valACYclovir (VALTREX) tablet 1,000 mg (1,000 mg Oral Given 07/12/22 1436)  pseudoephedrine (SUDAFED) 12 hr tablet 120 mg (120 mg Oral Given 07/12/22 1436)                                                                                                                                     Procedures Procedures  (including critical care time)  Medical Decision  Making / ED Course   This patient presents to the ED for concern of cough, rhinorrhea, penile rash, this involves an extensive number of treatment options, and is a complaint that carries with it a high risk of complications and morbidity.  The differential diagnosis includes unspecified viral URI, COVID-19, RSV, influenza, penile psoriasis, herpes genitalis, persistent hepatitis C, hepatitis a, biliary pathology  MDM: Patient seen emergency room for evaluation multiple complaints described above.  Physical exam with tenderness along the rib line on the right upper quadrant with no true right upper quadrant tenderness over the viscera.  There is a vesicular rash over the penis consistent with herpes genitalis.  Cardiopulmonary exam otherwise unremarkable.  Laboratory evaluation with an AST of 98, ALT 259 likely secondary to underlying chronic hepatitis.  Patient given Toradol for pain, Valtrex for the herpes genitalis and Sudafed for his upper respiratory symptoms.  An outpatient referral was placed to gastroenterology for further evaluation of his transaminitis and to help the patient plug-in with resources for eradication of hepatitis C.  At this time patient does not meet inpatient criteria for admission.  Patient then discharged on Valtrex.   Additional history obtained:  -External records from outside source obtained and reviewed including: Chart review including previous notes, labs, imaging, consultation notes   Lab Tests: -I ordered, reviewed, and interpreted labs.   The pertinent results include:   Labs Reviewed  COMPREHENSIVE METABOLIC PANEL - Abnormal; Notable for the following components:      Result Value   Glucose, Bld 102 (*)    AST 98 (*)    ALT 259 (*)    All other components within normal limits  CBC WITH DIFFERENTIAL/PLATELET      Medicines ordered and prescription drug management: Meds ordered this encounter  Medications   ketorolac (TORADOL) 15 MG/ML injection 15  mg   valACYclovir (VALTREX) tablet 1,000 mg   pseudoephedrine (SUDAFED) 12 hr tablet 120 mg   valACYclovir (VALTREX) 1000 MG tablet    Sig: Take 1 tablet (1,000 mg total) by mouth daily.    Dispense:  7 tablet    Refill:  0    -I have reviewed the patients home medicines and have made adjustments as needed  Critical interventions  none    Cardiac Monitoring: The patient was maintained on a cardiac monitor.  I personally viewed and interpreted the cardiac monitored which showed an underlying rhythm of: NSR  Social Determinants of Health:  Factors impacting patients care include: Recent incarceration, crack cocaine use   Reevaluation: After the interventions noted above, I reevaluated the patient and found that they have :improved  Co morbidities that complicate the patient evaluation  Past Medical History:  Diagnosis Date   Anxiety       Dispostion: I considered admission for this patient, but at this time patient does not meet inpatient criteria for admission and he is safe for discharge with outpatient gastroenterology follow-up     Final Clinical Impression(s) / ED Diagnoses Final diagnoses:  Herpes genitalis in men  Transaminitis  Hepatitis C virus infection without hepatic coma, unspecified chronicity     @PCDICTATION @    Glendora Score, MD 07/12/22 1952
